# Patient Record
Sex: Female | Born: 1990 | Race: Black or African American | Hispanic: No | Marital: Single | Smoking: Never smoker
Health system: Southern US, Community
[De-identification: ages and names within clinical notes are randomized; demographics above are authoritative.]

## PROBLEM LIST (undated history)

## (undated) DIAGNOSIS — E079 Disorder of thyroid, unspecified: Secondary | ICD-10-CM

---

## 2015-04-09 DIAGNOSIS — F3164 Bipolar disorder, current episode mixed, severe, with psychotic features: Secondary | ICD-10-CM | POA: Diagnosis not present

## 2015-04-16 DIAGNOSIS — F3164 Bipolar disorder, current episode mixed, severe, with psychotic features: Secondary | ICD-10-CM | POA: Diagnosis not present

## 2015-04-21 DIAGNOSIS — F312 Bipolar disorder, current episode manic severe with psychotic features: Secondary | ICD-10-CM | POA: Diagnosis not present

## 2015-04-23 DIAGNOSIS — F3164 Bipolar disorder, current episode mixed, severe, with psychotic features: Secondary | ICD-10-CM | POA: Diagnosis not present

## 2015-04-27 DIAGNOSIS — F3164 Bipolar disorder, current episode mixed, severe, with psychotic features: Secondary | ICD-10-CM | POA: Diagnosis not present

## 2015-04-29 DIAGNOSIS — F312 Bipolar disorder, current episode manic severe with psychotic features: Secondary | ICD-10-CM | POA: Diagnosis not present

## 2015-05-07 DIAGNOSIS — F3164 Bipolar disorder, current episode mixed, severe, with psychotic features: Secondary | ICD-10-CM | POA: Diagnosis not present

## 2015-05-17 DIAGNOSIS — F3164 Bipolar disorder, current episode mixed, severe, with psychotic features: Secondary | ICD-10-CM | POA: Diagnosis not present

## 2015-05-27 DIAGNOSIS — F3164 Bipolar disorder, current episode mixed, severe, with psychotic features: Secondary | ICD-10-CM | POA: Diagnosis not present

## 2015-06-02 DIAGNOSIS — F312 Bipolar disorder, current episode manic severe with psychotic features: Secondary | ICD-10-CM | POA: Diagnosis not present

## 2015-06-04 DIAGNOSIS — F3164 Bipolar disorder, current episode mixed, severe, with psychotic features: Secondary | ICD-10-CM | POA: Diagnosis not present

## 2015-06-11 DIAGNOSIS — F3164 Bipolar disorder, current episode mixed, severe, with psychotic features: Secondary | ICD-10-CM | POA: Diagnosis not present

## 2015-06-17 DIAGNOSIS — F3164 Bipolar disorder, current episode mixed, severe, with psychotic features: Secondary | ICD-10-CM | POA: Diagnosis not present

## 2015-06-25 DIAGNOSIS — F3164 Bipolar disorder, current episode mixed, severe, with psychotic features: Secondary | ICD-10-CM | POA: Diagnosis not present

## 2015-06-30 DIAGNOSIS — F312 Bipolar disorder, current episode manic severe with psychotic features: Secondary | ICD-10-CM | POA: Diagnosis not present

## 2015-07-02 DIAGNOSIS — F3164 Bipolar disorder, current episode mixed, severe, with psychotic features: Secondary | ICD-10-CM | POA: Diagnosis not present

## 2015-07-08 DIAGNOSIS — F3164 Bipolar disorder, current episode mixed, severe, with psychotic features: Secondary | ICD-10-CM | POA: Diagnosis not present

## 2015-07-16 DIAGNOSIS — F3164 Bipolar disorder, current episode mixed, severe, with psychotic features: Secondary | ICD-10-CM | POA: Diagnosis not present

## 2015-07-19 DIAGNOSIS — F3164 Bipolar disorder, current episode mixed, severe, with psychotic features: Secondary | ICD-10-CM | POA: Diagnosis not present

## 2015-07-27 DIAGNOSIS — F312 Bipolar disorder, current episode manic severe with psychotic features: Secondary | ICD-10-CM | POA: Diagnosis not present

## 2015-07-28 DIAGNOSIS — F3164 Bipolar disorder, current episode mixed, severe, with psychotic features: Secondary | ICD-10-CM | POA: Diagnosis not present

## 2015-08-04 DIAGNOSIS — F3164 Bipolar disorder, current episode mixed, severe, with psychotic features: Secondary | ICD-10-CM | POA: Diagnosis not present

## 2015-08-10 DIAGNOSIS — F3164 Bipolar disorder, current episode mixed, severe, with psychotic features: Secondary | ICD-10-CM | POA: Diagnosis not present

## 2015-08-11 DIAGNOSIS — Z5181 Encounter for therapeutic drug level monitoring: Secondary | ICD-10-CM | POA: Diagnosis not present

## 2015-08-18 DIAGNOSIS — F3164 Bipolar disorder, current episode mixed, severe, with psychotic features: Secondary | ICD-10-CM | POA: Diagnosis not present

## 2015-08-25 DIAGNOSIS — F312 Bipolar disorder, current episode manic severe with psychotic features: Secondary | ICD-10-CM | POA: Diagnosis not present

## 2015-08-25 DIAGNOSIS — F3164 Bipolar disorder, current episode mixed, severe, with psychotic features: Secondary | ICD-10-CM | POA: Diagnosis not present

## 2015-08-31 DIAGNOSIS — F3164 Bipolar disorder, current episode mixed, severe, with psychotic features: Secondary | ICD-10-CM | POA: Diagnosis not present

## 2015-09-08 DIAGNOSIS — F3164 Bipolar disorder, current episode mixed, severe, with psychotic features: Secondary | ICD-10-CM | POA: Diagnosis not present

## 2015-09-11 DIAGNOSIS — Z23 Encounter for immunization: Secondary | ICD-10-CM | POA: Diagnosis not present

## 2015-09-15 DIAGNOSIS — F3164 Bipolar disorder, current episode mixed, severe, with psychotic features: Secondary | ICD-10-CM | POA: Diagnosis not present

## 2015-09-21 DIAGNOSIS — F3164 Bipolar disorder, current episode mixed, severe, with psychotic features: Secondary | ICD-10-CM | POA: Diagnosis not present

## 2015-09-29 DIAGNOSIS — F312 Bipolar disorder, current episode manic severe with psychotic features: Secondary | ICD-10-CM | POA: Diagnosis not present

## 2015-09-30 DIAGNOSIS — F3164 Bipolar disorder, current episode mixed, severe, with psychotic features: Secondary | ICD-10-CM | POA: Diagnosis not present

## 2015-10-06 DIAGNOSIS — F3164 Bipolar disorder, current episode mixed, severe, with psychotic features: Secondary | ICD-10-CM | POA: Diagnosis not present

## 2015-10-21 DIAGNOSIS — F3164 Bipolar disorder, current episode mixed, severe, with psychotic features: Secondary | ICD-10-CM | POA: Diagnosis not present

## 2015-10-27 DIAGNOSIS — F312 Bipolar disorder, current episode manic severe with psychotic features: Secondary | ICD-10-CM | POA: Diagnosis not present

## 2015-11-16 DIAGNOSIS — F3164 Bipolar disorder, current episode mixed, severe, with psychotic features: Secondary | ICD-10-CM | POA: Diagnosis not present

## 2015-11-23 DIAGNOSIS — F3164 Bipolar disorder, current episode mixed, severe, with psychotic features: Secondary | ICD-10-CM | POA: Diagnosis not present

## 2015-12-01 DIAGNOSIS — F312 Bipolar disorder, current episode manic severe with psychotic features: Secondary | ICD-10-CM | POA: Diagnosis not present

## 2015-12-02 DIAGNOSIS — F3164 Bipolar disorder, current episode mixed, severe, with psychotic features: Secondary | ICD-10-CM | POA: Diagnosis not present

## 2015-12-17 DIAGNOSIS — F3164 Bipolar disorder, current episode mixed, severe, with psychotic features: Secondary | ICD-10-CM | POA: Diagnosis not present

## 2015-12-22 DIAGNOSIS — F312 Bipolar disorder, current episode manic severe with psychotic features: Secondary | ICD-10-CM | POA: Diagnosis not present

## 2015-12-23 DIAGNOSIS — F3164 Bipolar disorder, current episode mixed, severe, with psychotic features: Secondary | ICD-10-CM | POA: Diagnosis not present

## 2015-12-29 DIAGNOSIS — F3164 Bipolar disorder, current episode mixed, severe, with psychotic features: Secondary | ICD-10-CM | POA: Diagnosis not present

## 2016-01-07 DIAGNOSIS — F3164 Bipolar disorder, current episode mixed, severe, with psychotic features: Secondary | ICD-10-CM | POA: Diagnosis not present

## 2016-01-12 DIAGNOSIS — F312 Bipolar disorder, current episode manic severe with psychotic features: Secondary | ICD-10-CM | POA: Diagnosis not present

## 2016-01-21 DIAGNOSIS — F3164 Bipolar disorder, current episode mixed, severe, with psychotic features: Secondary | ICD-10-CM | POA: Diagnosis not present

## 2016-01-27 DIAGNOSIS — F3164 Bipolar disorder, current episode mixed, severe, with psychotic features: Secondary | ICD-10-CM | POA: Diagnosis not present

## 2016-02-03 DIAGNOSIS — F3164 Bipolar disorder, current episode mixed, severe, with psychotic features: Secondary | ICD-10-CM | POA: Diagnosis not present

## 2016-02-09 DIAGNOSIS — F312 Bipolar disorder, current episode manic severe with psychotic features: Secondary | ICD-10-CM | POA: Diagnosis not present

## 2016-02-16 DIAGNOSIS — E282 Polycystic ovarian syndrome: Secondary | ICD-10-CM | POA: Diagnosis not present

## 2016-02-17 DIAGNOSIS — F3164 Bipolar disorder, current episode mixed, severe, with psychotic features: Secondary | ICD-10-CM | POA: Diagnosis not present

## 2016-02-24 DIAGNOSIS — E282 Polycystic ovarian syndrome: Secondary | ICD-10-CM | POA: Diagnosis not present

## 2016-02-24 DIAGNOSIS — E039 Hypothyroidism, unspecified: Secondary | ICD-10-CM | POA: Diagnosis not present

## 2016-02-27 DIAGNOSIS — J018 Other acute sinusitis: Secondary | ICD-10-CM | POA: Diagnosis not present

## 2016-02-27 DIAGNOSIS — H10021 Other mucopurulent conjunctivitis, right eye: Secondary | ICD-10-CM | POA: Diagnosis not present

## 2016-03-03 DIAGNOSIS — Z124 Encounter for screening for malignant neoplasm of cervix: Secondary | ICD-10-CM | POA: Diagnosis not present

## 2016-03-03 DIAGNOSIS — Z01419 Encounter for gynecological examination (general) (routine) without abnormal findings: Secondary | ICD-10-CM | POA: Diagnosis not present

## 2016-03-03 DIAGNOSIS — Z6841 Body Mass Index (BMI) 40.0 and over, adult: Secondary | ICD-10-CM | POA: Diagnosis not present

## 2016-03-06 DIAGNOSIS — F312 Bipolar disorder, current episode manic severe with psychotic features: Secondary | ICD-10-CM | POA: Diagnosis not present

## 2016-03-10 DIAGNOSIS — F3164 Bipolar disorder, current episode mixed, severe, with psychotic features: Secondary | ICD-10-CM | POA: Diagnosis not present

## 2016-03-15 DIAGNOSIS — H10021 Other mucopurulent conjunctivitis, right eye: Secondary | ICD-10-CM | POA: Diagnosis not present

## 2016-03-15 DIAGNOSIS — J018 Other acute sinusitis: Secondary | ICD-10-CM | POA: Diagnosis not present

## 2016-03-21 DIAGNOSIS — F3164 Bipolar disorder, current episode mixed, severe, with psychotic features: Secondary | ICD-10-CM | POA: Diagnosis not present

## 2016-03-28 DIAGNOSIS — F3164 Bipolar disorder, current episode mixed, severe, with psychotic features: Secondary | ICD-10-CM | POA: Diagnosis not present

## 2016-04-03 DIAGNOSIS — K5900 Constipation, unspecified: Secondary | ICD-10-CM | POA: Diagnosis not present

## 2016-04-03 DIAGNOSIS — R109 Unspecified abdominal pain: Secondary | ICD-10-CM | POA: Diagnosis not present

## 2016-04-05 DIAGNOSIS — F3164 Bipolar disorder, current episode mixed, severe, with psychotic features: Secondary | ICD-10-CM | POA: Diagnosis not present

## 2016-04-14 DIAGNOSIS — F3164 Bipolar disorder, current episode mixed, severe, with psychotic features: Secondary | ICD-10-CM | POA: Diagnosis not present

## 2016-04-19 DIAGNOSIS — F312 Bipolar disorder, current episode manic severe with psychotic features: Secondary | ICD-10-CM | POA: Diagnosis not present

## 2016-04-28 DIAGNOSIS — F3164 Bipolar disorder, current episode mixed, severe, with psychotic features: Secondary | ICD-10-CM | POA: Diagnosis not present

## 2016-04-28 DIAGNOSIS — E282 Polycystic ovarian syndrome: Secondary | ICD-10-CM | POA: Diagnosis not present

## 2016-05-04 DIAGNOSIS — F3164 Bipolar disorder, current episode mixed, severe, with psychotic features: Secondary | ICD-10-CM | POA: Diagnosis not present

## 2016-05-10 DIAGNOSIS — F3164 Bipolar disorder, current episode mixed, severe, with psychotic features: Secondary | ICD-10-CM | POA: Diagnosis not present

## 2016-05-18 DIAGNOSIS — F3164 Bipolar disorder, current episode mixed, severe, with psychotic features: Secondary | ICD-10-CM | POA: Diagnosis not present

## 2016-05-22 DIAGNOSIS — R197 Diarrhea, unspecified: Secondary | ICD-10-CM | POA: Diagnosis not present

## 2016-05-27 ENCOUNTER — Encounter (HOSPITAL_COMMUNITY): Payer: Self-pay

## 2016-05-27 ENCOUNTER — Emergency Department (HOSPITAL_COMMUNITY)
Admission: EM | Admit: 2016-05-27 | Discharge: 2016-05-27 | Disposition: A | Discharge status: Home / Self Care | Payer: BLUE CROSS/BLUE SHIELD | Attending: Emergency Medicine | Admitting: Emergency Medicine

## 2016-05-27 DIAGNOSIS — R1030 Lower abdominal pain, unspecified: Secondary | ICD-10-CM | POA: Diagnosis not present

## 2016-05-27 DIAGNOSIS — R197 Diarrhea, unspecified: Secondary | ICD-10-CM | POA: Insufficient documentation

## 2016-05-27 HISTORY — DX: Disorder of thyroid, unspecified: E07.9

## 2016-05-27 LAB — URINALYSIS, ROUTINE W REFLEX MICROSCOPIC
BILIRUBIN URINE: NEGATIVE
GLUCOSE, UA: NEGATIVE mg/dL
HGB URINE DIPSTICK: NEGATIVE
Ketones, ur: NEGATIVE mg/dL
NITRITE: NEGATIVE
Protein, ur: NEGATIVE mg/dL
SPECIFIC GRAVITY, URINE: 1.01 (ref 1.005–1.030)
pH: 5 (ref 5.0–8.0)

## 2016-05-27 LAB — COMPREHENSIVE METABOLIC PANEL
ALK PHOS: 53 U/L (ref 38–126)
ALT: 10 U/L — ABNORMAL LOW (ref 14–54)
ANION GAP: 9 (ref 5–15)
AST: 16 U/L (ref 15–41)
Albumin: 4.1 g/dL (ref 3.5–5.0)
BUN: 6 mg/dL (ref 6–20)
CALCIUM: 9.6 mg/dL (ref 8.9–10.3)
CO2: 21 mmol/L — ABNORMAL LOW (ref 22–32)
Chloride: 105 mmol/L (ref 101–111)
Creatinine, Ser: 1.05 mg/dL — ABNORMAL HIGH (ref 0.44–1.00)
Glucose, Bld: 100 mg/dL — ABNORMAL HIGH (ref 65–99)
POTASSIUM: 4 mmol/L (ref 3.5–5.1)
Sodium: 135 mmol/L (ref 135–145)
TOTAL PROTEIN: 7.2 g/dL (ref 6.5–8.1)
Total Bilirubin: 0.6 mg/dL (ref 0.3–1.2)

## 2016-05-27 LAB — CBC
HEMATOCRIT: 39.3 % (ref 36.0–46.0)
HEMOGLOBIN: 12.7 g/dL (ref 12.0–15.0)
MCH: 28.5 pg (ref 26.0–34.0)
MCHC: 32.3 g/dL (ref 30.0–36.0)
MCV: 88.3 fL (ref 78.0–100.0)
Platelets: 188 10*3/uL (ref 150–400)
RBC: 4.45 MIL/uL (ref 3.87–5.11)
RDW: 13.3 % (ref 11.5–15.5)
WBC: 10.3 10*3/uL (ref 4.0–10.5)

## 2016-05-27 LAB — GASTROINTESTINAL PANEL BY PCR, STOOL (REPLACES STOOL CULTURE)
Adenovirus F40/41: NOT DETECTED
Astrovirus: NOT DETECTED
CAMPYLOBACTER SPECIES: NOT DETECTED
CRYPTOSPORIDIUM: NOT DETECTED
CYCLOSPORA CAYETANENSIS: NOT DETECTED
Entamoeba histolytica: NOT DETECTED
Enteroaggregative E coli (EAEC): NOT DETECTED
Enteropathogenic E coli (EPEC): NOT DETECTED
Enterotoxigenic E coli (ETEC): NOT DETECTED
Giardia lamblia: NOT DETECTED
Norovirus GI/GII: NOT DETECTED
PLESIMONAS SHIGELLOIDES: NOT DETECTED
ROTAVIRUS A: NOT DETECTED
SALMONELLA SPECIES: NOT DETECTED
SAPOVIRUS (I, II, IV, AND V): NOT DETECTED
SHIGELLA/ENTEROINVASIVE E COLI (EIEC): NOT DETECTED
Shiga like toxin producing E coli (STEC): NOT DETECTED
VIBRIO SPECIES: NOT DETECTED
Vibrio cholerae: NOT DETECTED
YERSINIA ENTEROCOLITICA: NOT DETECTED

## 2016-05-27 LAB — C DIFFICILE QUICK SCREEN W PCR REFLEX
C DIFFICILE (CDIFF) INTERP: NOT DETECTED
C Diff antigen: NEGATIVE
C Diff toxin: NEGATIVE

## 2016-05-27 LAB — LIPASE, BLOOD: Lipase: 20 U/L (ref 11–51)

## 2016-05-27 LAB — POC URINE PREG, ED: PREG TEST UR: NEGATIVE

## 2016-05-27 NOTE — ED Provider Notes (Signed)
[facility]-EMERGENCY DEPT Provider Note   CSN: [id number] Arrival date & time: 05/27/16  0006   By signing my name below, I, [name], attest that this documentation has been prepared under the direction and in the presence of Derwood KaplanNanavati, Rook Maue, MD. Electronically Signed: [name], ED Scribe. 05/27/16. 10:06 PM.  History   Chief Complaint Chief Complaint  Patient presents with  . Abdominal Cramping  . Diarrhea    HPI Sydney [name] is a 26 y.o. female who presents to the Emergency Department complaining of lower abdominal pain onset 1 month ago. Pt reports associated intermittent diarrhea x 1.5 months. She notes that she has 3 watery, loose yellow, non-bloody, bowel movements daily. Pt reports that her lower abdominal pain is present prior to diarrhea episodes and resolved immediately following emptying her bowels. She notes that she increased the dose of her metformin to 500 mg BID approximately 2 months ago and she takes it for her hx of PCOS. She notes that she recently completed taking doxycyline, tobramycin, and bactrim. Pt has not tried any medications for the relief of her symptoms. She denies blood in stool or mucus in the stool. Denies PMHx of C. diff. Denies family hx of bowel issues, but notes that she has had a gastric ulcer and gastritis. Denies recent travel to the tropics, camping, or sick contacts with similar symptoms.    The history is provided by the patient and a friend. No language interpreter was used.    Past Medical History:  Diagnosis Date  . Thyroid disease     There are no active problems to display for this patient.   No past surgical history on file.  OB History    No data available       Home Medications    Prior to Admission medications   Not on File    Family History No family history on file.  Social History Social History  Substance Use Topics  . Smoking status: Never Smoker  . Smokeless tobacco: Never Used  . Alcohol use No      Allergies   Penicillins   Review of Systems Review of Systems  Gastrointestinal: Positive for abdominal pain and diarrhea. Negative for blood in stool.  All other systems reviewed and are negative.    Physical Exam Updated Vital Signs BP 126/77 (BP Location: Left Arm)   Pulse 76   Temp 98.4 F (36.9 C) (Oral)   Resp 18   LMP 05/16/2016 (Approximate)   SpO2 100%   Physical Exam  Constitutional: She is oriented to person, place, and time. She appears well-developed and well-nourished. No distress.  HENT:  Head: Normocephalic and atraumatic.  Eyes: EOM are normal.  Neck: Neck supple.  Cardiovascular: Normal rate, regular rhythm and normal heart sounds.  Exam reveals no gallop and no friction rub.   No murmur heard. Pulmonary/Chest: Effort normal and breath sounds normal. No respiratory distress. She has no wheezes. She has no rales.  Abdominal: Soft. Bowel sounds are normal. She exhibits no distension. There is no tenderness.  Abdomen soft and non-TTP.   Musculoskeletal: Normal range of motion.  Neurological: She is alert and oriented to person, place, and time.  Skin: Skin is warm and dry.  Psychiatric: She has a normal mood and affect. Her behavior is normal.  Nursing note and vitals reviewed.    ED Treatments / Results  DIAGNOSTIC STUDIES: Oxygen Saturation is 100% on RA, nl by my interpretation.    COORDINATION OF CARE: 3:54 AM  Discussed treatment plan with pt at bedside which includes labs, UA, and pt agreed to plan.   Labs (all labs ordered are listed, but only abnormal results are displayed) Labs Reviewed  COMPREHENSIVE METABOLIC PANEL - Abnormal; Notable for the following:       Result Value   CO2 21 (*)    Glucose, Bld 100 (*)    Creatinine, Ser 1.05 (*)    ALT 10 (*)    All other components within normal limits  URINALYSIS, ROUTINE W REFLEX MICROSCOPIC - Abnormal; Notable for the following:    APPearance HAZY (*)    Leukocytes, UA TRACE (*)     Bacteria, UA FEW (*)    Squamous Epithelial / LPF 0-5 (*)    All other components within normal limits  C DIFFICILE QUICK SCREEN W PCR REFLEX  GASTROINTESTINAL PANEL BY PCR, STOOL (REPLACES STOOL CULTURE)  LIPASE, BLOOD  CBC  POC URINE PREG, ED    EKG  EKG Interpretation None       Radiology No results found.  Procedures Procedures (including critical care time)  Medications Ordered in ED Medications - No data to display   Initial Impression / Assessment and Plan / ED Course  I have reviewed the triage vital signs and the nursing notes.  Pertinent labs & imaging results that were available during my care of the patient were reviewed by me and considered in my medical decision making (see chart for details).     Pt comes in with cc of diarrhea. 3-5 loose BM, different colored for the past several days. Pt has had increased metformin for same duration. Pt has had doxycycline and some UTI antibiotics in the last 1 month. Pt has PCOS hx.  Cdiff is possible, due to antibiotics mainly. Doesn't appear to be a viral infection, mainly due to the duration. Metformin can cause diarrhea, but she has some pain associated with her diarrhea, which is not typical of metformin.  Pt is to see GI doctors in 10 days. Cdiff sent. Pt would prefer going home now rather than wait for results as she has been in the ER for a long time already - I think that's reasonable. Stable for d/c.  LATE ENTRY: CDIFF STUDY WAS NEGATIVE.  Final Clinical Impressions(s) / ED Diagnoses   Final diagnoses:  Diarrhea, unspecified type    New Prescriptions There are no discharge medications for this patient.  I personally performed the services described in this documentation, which was scribed in my presence. The recorded information has been reviewed and is accurate.     Derwood Kaplan, MD 05/30/16 2207

## 2016-05-27 NOTE — ED Notes (Signed)
Pt requesting to see MD per secretary, Dr. Rhunette CroftNanavati notified

## 2016-05-27 NOTE — ED Notes (Signed)
Pt stable, ambulatory, states understanding of discharge instructions 

## 2016-05-27 NOTE — Discharge Instructions (Signed)
Half the metformin dose for now. See the GI doctor as planned. We will call you back if the stool studies come back positive.  Call your insurance company and get a primary doctor that is within your network.

## 2016-05-27 NOTE — ED Triage Notes (Signed)
Pt states recently finished abx for UTI. Pt complaining of intermittent diarrhea x 1 month. Pt states 3 BM's today. Pt states some abdominal cramping. Pt states bland diet today.

## 2016-05-30 NOTE — ED Notes (Signed)
Pt. Called this RN and left a message concerning her C-Diff culture results.  Called provided phone number, no answer, unable to leave a voice mail.

## 2016-06-01 DIAGNOSIS — F3164 Bipolar disorder, current episode mixed, severe, with psychotic features: Secondary | ICD-10-CM | POA: Diagnosis not present

## 2016-06-06 DIAGNOSIS — F312 Bipolar disorder, current episode manic severe with psychotic features: Secondary | ICD-10-CM | POA: Diagnosis not present

## 2016-06-06 DIAGNOSIS — K580 Irritable bowel syndrome with diarrhea: Secondary | ICD-10-CM | POA: Diagnosis not present

## 2016-06-06 DIAGNOSIS — R197 Diarrhea, unspecified: Secondary | ICD-10-CM | POA: Diagnosis not present

## 2016-06-08 DIAGNOSIS — F3164 Bipolar disorder, current episode mixed, severe, with psychotic features: Secondary | ICD-10-CM | POA: Diagnosis not present

## 2016-06-22 DIAGNOSIS — F3164 Bipolar disorder, current episode mixed, severe, with psychotic features: Secondary | ICD-10-CM | POA: Diagnosis not present

## 2016-07-06 DIAGNOSIS — F3164 Bipolar disorder, current episode mixed, severe, with psychotic features: Secondary | ICD-10-CM | POA: Diagnosis not present

## 2016-07-21 DIAGNOSIS — R197 Diarrhea, unspecified: Secondary | ICD-10-CM | POA: Diagnosis not present

## 2016-07-27 DIAGNOSIS — F3164 Bipolar disorder, current episode mixed, severe, with psychotic features: Secondary | ICD-10-CM | POA: Diagnosis not present

## 2016-07-31 DIAGNOSIS — F3164 Bipolar disorder, current episode mixed, severe, with psychotic features: Secondary | ICD-10-CM | POA: Diagnosis not present

## 2016-08-10 DIAGNOSIS — F3164 Bipolar disorder, current episode mixed, severe, with psychotic features: Secondary | ICD-10-CM | POA: Diagnosis not present

## 2016-08-17 DIAGNOSIS — F3164 Bipolar disorder, current episode mixed, severe, with psychotic features: Secondary | ICD-10-CM | POA: Diagnosis not present

## 2016-08-24 DIAGNOSIS — F3164 Bipolar disorder, current episode mixed, severe, with psychotic features: Secondary | ICD-10-CM | POA: Diagnosis not present

## 2016-08-25 DIAGNOSIS — M79672 Pain in left foot: Secondary | ICD-10-CM | POA: Diagnosis not present

## 2016-08-25 DIAGNOSIS — M722 Plantar fascial fibromatosis: Secondary | ICD-10-CM | POA: Diagnosis not present

## 2016-08-31 DIAGNOSIS — F3164 Bipolar disorder, current episode mixed, severe, with psychotic features: Secondary | ICD-10-CM | POA: Diagnosis not present

## 2016-09-07 DIAGNOSIS — F3164 Bipolar disorder, current episode mixed, severe, with psychotic features: Secondary | ICD-10-CM | POA: Diagnosis not present

## 2016-09-07 DIAGNOSIS — M722 Plantar fascial fibromatosis: Secondary | ICD-10-CM | POA: Diagnosis not present

## 2016-09-12 DIAGNOSIS — F3164 Bipolar disorder, current episode mixed, severe, with psychotic features: Secondary | ICD-10-CM | POA: Diagnosis not present

## 2016-09-21 DIAGNOSIS — F312 Bipolar disorder, current episode manic severe with psychotic features: Secondary | ICD-10-CM | POA: Diagnosis not present

## 2016-09-21 DIAGNOSIS — F3164 Bipolar disorder, current episode mixed, severe, with psychotic features: Secondary | ICD-10-CM | POA: Diagnosis not present

## 2016-09-28 DIAGNOSIS — F3164 Bipolar disorder, current episode mixed, severe, with psychotic features: Secondary | ICD-10-CM | POA: Diagnosis not present

## 2016-10-05 DIAGNOSIS — F3164 Bipolar disorder, current episode mixed, severe, with psychotic features: Secondary | ICD-10-CM | POA: Diagnosis not present

## 2016-10-18 DIAGNOSIS — Z23 Encounter for immunization: Secondary | ICD-10-CM | POA: Diagnosis not present

## 2016-10-19 DIAGNOSIS — F3164 Bipolar disorder, current episode mixed, severe, with psychotic features: Secondary | ICD-10-CM | POA: Diagnosis not present

## 2016-10-26 DIAGNOSIS — F3164 Bipolar disorder, current episode mixed, severe, with psychotic features: Secondary | ICD-10-CM | POA: Diagnosis not present

## 2016-10-29 ENCOUNTER — Encounter (HOSPITAL_COMMUNITY): Payer: Self-pay | Admitting: Emergency Medicine

## 2016-10-29 ENCOUNTER — Observation Stay (HOSPITAL_COMMUNITY)
Admission: EM | Admit: 2016-10-29 | Discharge: 2016-10-31 | Disposition: A | Discharge status: Home / Self Care | Payer: BLUE CROSS/BLUE SHIELD | Attending: Surgery | Admitting: Surgery

## 2016-10-29 DIAGNOSIS — E282 Polycystic ovarian syndrome: Secondary | ICD-10-CM | POA: Insufficient documentation

## 2016-10-29 DIAGNOSIS — Z79899 Other long term (current) drug therapy: Secondary | ICD-10-CM | POA: Insufficient documentation

## 2016-10-29 DIAGNOSIS — Z3202 Encounter for pregnancy test, result negative: Secondary | ICD-10-CM | POA: Diagnosis not present

## 2016-10-29 DIAGNOSIS — K8050 Calculus of bile duct without cholangitis or cholecystitis without obstruction: Secondary | ICD-10-CM | POA: Diagnosis not present

## 2016-10-29 DIAGNOSIS — R1013 Epigastric pain: Secondary | ICD-10-CM | POA: Diagnosis not present

## 2016-10-29 DIAGNOSIS — K219 Gastro-esophageal reflux disease without esophagitis: Secondary | ICD-10-CM | POA: Diagnosis not present

## 2016-10-29 DIAGNOSIS — E039 Hypothyroidism, unspecified: Secondary | ICD-10-CM | POA: Insufficient documentation

## 2016-10-29 DIAGNOSIS — K810 Acute cholecystitis: Principal | ICD-10-CM | POA: Diagnosis present

## 2016-10-29 DIAGNOSIS — Z6841 Body Mass Index (BMI) 40.0 and over, adult: Secondary | ICD-10-CM | POA: Diagnosis not present

## 2016-10-29 DIAGNOSIS — R110 Nausea: Secondary | ICD-10-CM | POA: Diagnosis not present

## 2016-10-29 DIAGNOSIS — F319 Bipolar disorder, unspecified: Secondary | ICD-10-CM | POA: Insufficient documentation

## 2016-10-29 LAB — COMPREHENSIVE METABOLIC PANEL
ALBUMIN: 3.6 g/dL (ref 3.5–5.0)
ALK PHOS: 53 U/L (ref 38–126)
ALT: 9 U/L — AB (ref 14–54)
ANION GAP: 9 (ref 5–15)
AST: 13 U/L — ABNORMAL LOW (ref 15–41)
BUN: <5 mg/dL — ABNORMAL LOW (ref 6–20)
CHLORIDE: 108 mmol/L (ref 101–111)
CO2: 17 mmol/L — AB (ref 22–32)
Calcium: 9.3 mg/dL (ref 8.9–10.3)
Creatinine, Ser: 0.73 mg/dL (ref 0.44–1.00)
GFR calc Af Amer: >60 mL/min (ref >60)
GFR calc non Af Amer: >60 mL/min (ref >60)
GLUCOSE: 112 mg/dL — AB (ref 65–99)
Potassium: 4 mmol/L (ref 3.5–5.1)
SODIUM: 134 mmol/L — AB (ref 135–145)
Total Bilirubin: 1 mg/dL (ref 0.3–1.2)
Total Protein: 7.2 g/dL (ref 6.5–8.1)

## 2016-10-29 LAB — URINALYSIS, ROUTINE W REFLEX MICROSCOPIC
Bilirubin Urine: NEGATIVE
Glucose, UA: NEGATIVE mg/dL
Hgb urine dipstick: NEGATIVE
KETONES UR: 20 mg/dL — AB
Nitrite: NEGATIVE
Protein, ur: NEGATIVE mg/dL
Specific Gravity, Urine: 1.014 (ref 1.005–1.030)
pH: 7 (ref 5.0–8.0)

## 2016-10-29 LAB — CBC
HCT: 38.4 % (ref 36.0–46.0)
HEMOGLOBIN: 12.5 g/dL (ref 12.0–15.0)
MCH: 28.7 pg (ref 26.0–34.0)
MCHC: 32.6 g/dL (ref 30.0–36.0)
MCV: 88.3 fL (ref 78.0–100.0)
Platelets: 169 10*3/uL (ref 150–400)
RBC: 4.35 MIL/uL (ref 3.87–5.11)
RDW: 13.1 % (ref 11.5–15.5)
WBC: 14.6 10*3/uL — ABNORMAL HIGH (ref 4.0–10.5)

## 2016-10-29 LAB — I-STAT BETA HCG BLOOD, ED (MC, WL, AP ONLY)

## 2016-10-29 LAB — LIPASE, BLOOD: LIPASE: 16 U/L (ref 11–51)

## 2016-10-29 NOTE — ED Triage Notes (Signed)
Pt states that she is been having abd pain since this morning was seen on a UC and sent home with a diagnose of GERD when pt got to her house she started vomiting green bile secretion and call hotline RN and they suggested for pt to come to the ED. No fever or chills.

## 2016-10-30 ENCOUNTER — Emergency Department (HOSPITAL_COMMUNITY): Payer: BLUE CROSS/BLUE SHIELD

## 2016-10-30 ENCOUNTER — Encounter (HOSPITAL_COMMUNITY)
Admission: EM | Disposition: A | Discharge status: Home / Self Care | Payer: Self-pay | Source: Home / Self Care | Attending: Emergency Medicine

## 2016-10-30 ENCOUNTER — Encounter (HOSPITAL_COMMUNITY): Payer: Self-pay | Admitting: Anesthesiology

## 2016-10-30 ENCOUNTER — Observation Stay (HOSPITAL_COMMUNITY): Payer: BLUE CROSS/BLUE SHIELD | Admitting: Anesthesiology

## 2016-10-30 DIAGNOSIS — K219 Gastro-esophageal reflux disease without esophagitis: Secondary | ICD-10-CM | POA: Diagnosis not present

## 2016-10-30 DIAGNOSIS — K810 Acute cholecystitis: Secondary | ICD-10-CM | POA: Diagnosis present

## 2016-10-30 DIAGNOSIS — R1013 Epigastric pain: Secondary | ICD-10-CM | POA: Diagnosis not present

## 2016-10-30 DIAGNOSIS — F319 Bipolar disorder, unspecified: Secondary | ICD-10-CM | POA: Diagnosis not present

## 2016-10-30 DIAGNOSIS — K8020 Calculus of gallbladder without cholecystitis without obstruction: Secondary | ICD-10-CM | POA: Diagnosis not present

## 2016-10-30 DIAGNOSIS — K8000 Calculus of gallbladder with acute cholecystitis without obstruction: Secondary | ICD-10-CM | POA: Diagnosis not present

## 2016-10-30 DIAGNOSIS — R1011 Right upper quadrant pain: Secondary | ICD-10-CM | POA: Diagnosis not present

## 2016-10-30 DIAGNOSIS — E039 Hypothyroidism, unspecified: Secondary | ICD-10-CM | POA: Diagnosis not present

## 2016-10-30 HISTORY — PX: CHOLECYSTECTOMY: SHX55

## 2016-10-30 SURGERY — LAPAROSCOPIC CHOLECYSTECTOMY WITH INTRAOPERATIVE CHOLANGIOGRAM
Anesthesia: General | Site: Abdomen

## 2016-10-30 MED ORDER — MORPHINE SULFATE (PF) 4 MG/ML IV SOLN
4.0000 mg | Freq: Once | INTRAVENOUS | Status: AC
  Administered 2016-10-30: 4 mg via INTRAVENOUS
  Filled 2016-10-30: qty 1

## 2016-10-30 MED ORDER — FENTANYL CITRATE (PF) 100 MCG/2ML IJ SOLN
INTRAMUSCULAR | Status: DC | PRN
  Administered 2016-10-30: 75 ug via INTRAVENOUS
  Administered 2016-10-30 (×2): 25 ug via INTRAVENOUS

## 2016-10-30 MED ORDER — GLYCOPYRROLATE 0.2 MG/ML IJ SOLN
INTRAMUSCULAR | Status: DC | PRN
  Administered 2016-10-30: 0.4 mg via INTRAVENOUS

## 2016-10-30 MED ORDER — MIDAZOLAM HCL 2 MG/2ML IJ SOLN
INTRAMUSCULAR | Status: AC
  Filled 2016-10-30: qty 2

## 2016-10-30 MED ORDER — LACTATED RINGERS IV SOLN
INTRAVENOUS | Status: DC
  Administered 2016-10-30: 11:00:00 via INTRAVENOUS

## 2016-10-30 MED ORDER — LIDOCAINE 2% (20 MG/ML) 5 ML SYRINGE
INTRAMUSCULAR | Status: AC
  Filled 2016-10-30: qty 5

## 2016-10-30 MED ORDER — MIDAZOLAM HCL 5 MG/5ML IJ SOLN
INTRAMUSCULAR | Status: DC | PRN
  Administered 2016-10-30: 2 mg via INTRAVENOUS

## 2016-10-30 MED ORDER — PROPOFOL 10 MG/ML IV BOLUS
INTRAVENOUS | Status: AC
  Filled 2016-10-30: qty 20

## 2016-10-30 MED ORDER — FENTANYL CITRATE (PF) 250 MCG/5ML IJ SOLN
INTRAMUSCULAR | Status: AC
  Filled 2016-10-30: qty 5

## 2016-10-30 MED ORDER — SODIUM CHLORIDE 0.9 % IV BOLUS (SEPSIS)
1000.0000 mL | Freq: Once | INTRAVENOUS | Status: AC
  Administered 2016-10-30: 1000 mL via INTRAVENOUS

## 2016-10-30 MED ORDER — ROCURONIUM BROMIDE 10 MG/ML (PF) SYRINGE
PREFILLED_SYRINGE | INTRAVENOUS | Status: AC
  Filled 2016-10-30: qty 5

## 2016-10-30 MED ORDER — ONDANSETRON HCL 4 MG/2ML IJ SOLN
INTRAMUSCULAR | Status: AC
  Filled 2016-10-30: qty 2

## 2016-10-30 MED ORDER — OXYCODONE HCL 5 MG PO TABS
5.0000 mg | ORAL_TABLET | Freq: Once | ORAL | Status: AC | PRN
  Administered 2016-10-30: 5 mg via ORAL

## 2016-10-30 MED ORDER — IOPAMIDOL (ISOVUE-300) INJECTION 61%
INTRAVENOUS | Status: AC
  Filled 2016-10-30: qty 50

## 2016-10-30 MED ORDER — ROCURONIUM BROMIDE 100 MG/10ML IV SOLN
INTRAVENOUS | Status: DC | PRN
  Administered 2016-10-30: 30 mg via INTRAVENOUS
  Administered 2016-10-30: 10 mg via INTRAVENOUS

## 2016-10-30 MED ORDER — BUPIVACAINE-EPINEPHRINE 0.25% -1:200000 IJ SOLN: INTRAMUSCULAR | Status: DC | PRN

## 2016-10-30 MED ORDER — 0.9 % SODIUM CHLORIDE (POUR BTL) OPTIME
TOPICAL | Status: DC | PRN
  Administered 2016-10-30: 1000 mL

## 2016-10-30 MED ORDER — DEXTROSE-NACL 5-0.9 % IV SOLN
INTRAVENOUS | Status: DC
  Administered 2016-10-30 (×2): via INTRAVENOUS

## 2016-10-30 MED ORDER — CIPROFLOXACIN IN D5W 400 MG/200ML IV SOLN
400.0000 mg | Freq: Two times a day (BID) | INTRAVENOUS | Status: DC
  Administered 2016-10-30 – 2016-10-31 (×3): 400 mg via INTRAVENOUS
  Filled 2016-10-30 (×3): qty 200

## 2016-10-30 MED ORDER — ONDANSETRON HCL 4 MG/2ML IJ SOLN
4.0000 mg | Freq: Once | INTRAMUSCULAR | Status: AC
  Administered 2016-10-30: 4 mg via INTRAVENOUS

## 2016-10-30 MED ORDER — OXYCODONE HCL 5 MG/5ML PO SOLN: 5.0000 mg | Freq: Once | ORAL | Status: AC | PRN

## 2016-10-30 MED ORDER — LIDOCAINE HCL (CARDIAC) 20 MG/ML IV SOLN
INTRAVENOUS | Status: DC | PRN
  Administered 2016-10-30: 40 mg via INTRAVENOUS

## 2016-10-30 MED ORDER — NEOSTIGMINE METHYLSULFATE 10 MG/10ML IV SOLN
INTRAVENOUS | Status: DC | PRN
  Administered 2016-10-30: 3 mg via INTRAVENOUS

## 2016-10-30 MED ORDER — OXYCODONE HCL 5 MG PO TABS
ORAL_TABLET | ORAL | Status: AC
  Administered 2016-10-30: 14:00:00
  Filled 2016-10-30: qty 1

## 2016-10-30 MED ORDER — NEOSTIGMINE METHYLSULFATE 5 MG/5ML IV SOSY
PREFILLED_SYRINGE | INTRAVENOUS | Status: AC
  Filled 2016-10-30: qty 5

## 2016-10-30 MED ORDER — SODIUM CHLORIDE 0.9 % IR SOLN
Status: DC | PRN
  Administered 2016-10-30: 1

## 2016-10-30 MED ORDER — DEXAMETHASONE SODIUM PHOSPHATE 10 MG/ML IJ SOLN
INTRAMUSCULAR | Status: DC | PRN
  Administered 2016-10-30: 10 mg via INTRAVENOUS

## 2016-10-30 MED ORDER — ONDANSETRON 4 MG PO TBDP: 4.0000 mg | ORAL_TABLET | Freq: Four times a day (QID) | ORAL | Status: DC | PRN

## 2016-10-30 MED ORDER — BUPIVACAINE-EPINEPHRINE (PF) 0.25% -1:200000 IJ SOLN
INTRAMUSCULAR | Status: AC
  Filled 2016-10-30: qty 30

## 2016-10-30 MED ORDER — DEXAMETHASONE SODIUM PHOSPHATE 10 MG/ML IJ SOLN
INTRAMUSCULAR | Status: AC
  Filled 2016-10-30: qty 1

## 2016-10-30 MED ORDER — ONDANSETRON HCL 4 MG/2ML IJ SOLN
INTRAMUSCULAR | Status: DC | PRN
  Administered 2016-10-30: 4 mg via INTRAVENOUS

## 2016-10-30 MED ORDER — HYDROMORPHONE HCL 1 MG/ML IJ SOLN
0.2500 mg | INTRAMUSCULAR | Status: DC | PRN
  Administered 2016-10-30: 0.5 mg via INTRAVENOUS

## 2016-10-30 MED ORDER — HYDROMORPHONE HCL 1 MG/ML IJ SOLN
INTRAMUSCULAR | Status: AC
  Administered 2016-10-30: 14:00:00
  Filled 2016-10-30: qty 1

## 2016-10-30 MED ORDER — ONDANSETRON HCL 4 MG/2ML IJ SOLN
4.0000 mg | Freq: Four times a day (QID) | INTRAMUSCULAR | Status: DC | PRN
  Administered 2016-10-30: 4 mg via INTRAVENOUS
  Filled 2016-10-30: qty 2

## 2016-10-30 MED ORDER — OXYCODONE HCL 5 MG PO TABS
5.0000 mg | ORAL_TABLET | ORAL | Status: DC | PRN
  Administered 2016-10-30 – 2016-10-31 (×2): 10 mg via ORAL
  Filled 2016-10-30 (×3): qty 2

## 2016-10-30 MED ORDER — CIPROFLOXACIN IN D5W 400 MG/200ML IV SOLN
400.0000 mg | Freq: Two times a day (BID) | INTRAVENOUS | Status: DC
  Filled 2016-10-30: qty 200

## 2016-10-30 MED ORDER — PROMETHAZINE HCL 25 MG/ML IJ SOLN: 6.2500 mg | INTRAMUSCULAR | Status: DC | PRN

## 2016-10-30 MED ORDER — HYDROMORPHONE HCL 1 MG/ML IJ SOLN
1.0000 mg | INTRAMUSCULAR | Status: DC | PRN
  Administered 2016-10-30: 1 mg via INTRAVENOUS
  Filled 2016-10-30 (×2): qty 1

## 2016-10-30 MED ORDER — PROPOFOL 10 MG/ML IV BOLUS
INTRAVENOUS | Status: DC | PRN
  Administered 2016-10-30: 120 mg via INTRAVENOUS

## 2016-10-30 MED ORDER — MORPHINE SULFATE (PF) 4 MG/ML IV SOLN: 1.0000 mg | INTRAVENOUS | Status: DC | PRN

## 2016-10-30 SURGICAL SUPPLY — 34 items
APPLIER CLIP 5 13 M/L LIGAMAX5 (MISCELLANEOUS) ×2
BLADE CLIPPER SURG (BLADE) IMPLANT
CANISTER SUCT 3000ML PPV (MISCELLANEOUS) ×2 IMPLANT
CHLORAPREP W/TINT 26ML (MISCELLANEOUS) ×2 IMPLANT
CLIP APPLIE 5 13 M/L LIGAMAX5 (MISCELLANEOUS) ×1 IMPLANT
COVER MAYO STAND STRL (DRAPES) IMPLANT
COVER SURGICAL LIGHT HANDLE (MISCELLANEOUS) ×2 IMPLANT
DERMABOND ADVANCED (GAUZE/BANDAGES/DRESSINGS) ×1
DERMABOND ADVANCED .7 DNX12 (GAUZE/BANDAGES/DRESSINGS) ×1 IMPLANT
DRAPE C-ARM 42X72 X-RAY (DRAPES) IMPLANT
ELECT REM PT RETURN 9FT ADLT (ELECTROSURGICAL) ×2
ELECTRODE REM PT RTRN 9FT ADLT (ELECTROSURGICAL) ×1 IMPLANT
GLOVE SURG SIGNA 7.5 PF LTX (GLOVE) ×2 IMPLANT
GOWN STRL REUS W/ TWL LRG LVL3 (GOWN DISPOSABLE) ×2 IMPLANT
GOWN STRL REUS W/ TWL XL LVL3 (GOWN DISPOSABLE) ×1 IMPLANT
GOWN STRL REUS W/TWL LRG LVL3 (GOWN DISPOSABLE) ×2
GOWN STRL REUS W/TWL XL LVL3 (GOWN DISPOSABLE) ×1
KIT BASIN OR (CUSTOM PROCEDURE TRAY) ×2 IMPLANT
KIT ROOM TURNOVER OR (KITS) ×2 IMPLANT
NS IRRIG 1000ML POUR BTL (IV SOLUTION) ×2 IMPLANT
PAD ARMBOARD 7.5X6 YLW CONV (MISCELLANEOUS) ×2 IMPLANT
POUCH SPECIMEN RETRIEVAL 10MM (ENDOMECHANICALS) ×2 IMPLANT
SCISSORS LAP 5X35 DISP (ENDOMECHANICALS) ×2 IMPLANT
SET CHOLANGIOGRAPH 5 50 .035 (SET/KITS/TRAYS/PACK) IMPLANT
SET IRRIG TUBING LAPAROSCOPIC (IRRIGATION / IRRIGATOR) ×2 IMPLANT
SLEEVE ENDOPATH XCEL 5M (ENDOMECHANICALS) ×4 IMPLANT
SPECIMEN JAR SMALL (MISCELLANEOUS) ×2 IMPLANT
SUT MNCRL AB 4-0 PS2 18 (SUTURE) ×2 IMPLANT
TOWEL OR 17X24 6PK STRL BLUE (TOWEL DISPOSABLE) ×2 IMPLANT
TOWEL OR 17X26 10 PK STRL BLUE (TOWEL DISPOSABLE) ×2 IMPLANT
TRAY LAPAROSCOPIC MC (CUSTOM PROCEDURE TRAY) ×2 IMPLANT
TROCAR XCEL BLUNT TIP 100MML (ENDOMECHANICALS) ×2 IMPLANT
TROCAR XCEL NON-BLD 5MMX100MML (ENDOMECHANICALS) ×2 IMPLANT
TUBING INSUFFLATION (TUBING) ×2 IMPLANT

## 2016-10-30 NOTE — ED Notes (Signed)
[facility]

## 2016-10-30 NOTE — Progress Notes (Signed)
Patient ID: Sydney [name], female   DOB: January 12, 1990, 26 y.o.   MRN: [id number]   Patient with symptomatic gallstones, possible acute cholecystitis.  A laparoscopic cholecystectomy is planned for today I discussed the procedure in detail.    We discussed the risks and benefits of a laparoscopic cholecystectomy and possible cholangiogram including, but not limited to bleeding, infection, injury to surrounding structures such as the intestine or liver, bile leak, retained gallstones, need to convert to an open procedure, prolonged diarrhea, blood clots such as  DVT, common bile duct injury, anesthesia risks, and possible need for additional procedures.  The likelihood of improvement in symptoms and return to the patient's normal status is good. We discussed the typical post-operative recovery course.

## 2016-10-30 NOTE — Progress Notes (Addendum)
Called Dietary to order CLD VEGAN dinner tray.  Unable to Modify order. Dietary only has Vegetarian, notified pt and she said if she feels hungry she will have boyfriend get something.

## 2016-10-30 NOTE — Op Note (Signed)
Laparoscopic Cholecystectomy Procedure Note  Indications: This patient presents with symptomatic gallbladder disease and will undergo laparoscopic cholecystectomy.  Pre-operative Diagnosis: Calculus of gallbladder with acute cholecystitis, without mention of obstruction  Post-operative Diagnosis: Same  Surgeon: Abigail MiyamotoBLACKMAN,Sydney Sydney [name]   Assistants: 0  Anesthesia: General endotracheal anesthesia  ASA Class: 2  Procedure Details  The patient was seen again in the Holding Room. The risks, benefits, complications, treatment options, and expected outcomes were discussed with the patient. The possibilities of reaction to medication, pulmonary aspiration, perforation of viscus, bleeding, recurrent infection, finding Sydney [name] normal gallbladder, the need for additional procedures, failure to diagnose Sydney [name] condition, the possible need to convert to an open procedure, and creating Sydney [name] complication requiring transfusion or operation were discussed with the patient. The likelihood of improving the patient's symptoms with return to their baseline status is good.  The patient and/or family concurred with the proposed plan, giving informed consent. The site of surgery properly noted. The patient was taken to Operating Room, identified as Sydney Sydney [name] and the procedure verified as Laparoscopic Cholecystectomy with Intraoperative Cholangiogram. Sydney [name] Time Out was held and the above information confirmed.  Prior to the induction of general anesthesia, antibiotic prophylaxis was administered. General endotracheal anesthesia was then administered and tolerated well. After the induction, the abdomen was prepped with Chloraprep and draped in sterile fashion. The patient was positioned in the supine position.  Local anesthetic agent was injected into the skin near the umbilicus and an incision made. We dissected down to the abdominal fascia with blunt dissection.  The fascia was incised vertically and we entered the peritoneal cavity  bluntly.  Sydney [name] pursestring suture of 0-Vicryl was placed around the fascial opening.  The Hasson cannula was inserted and secured with the stay suture.  Pneumoperitoneum was then created with CO2 and tolerated well without any adverse changes in the patient's vital signs. Sydney [name] 5-mm port was placed in the subxiphoid position.  Two 5-mm ports were placed in the right upper quadrant. All skin incisions were infiltrated with Sydney [name] local anesthetic agent before making the incision and placing the trocars.   We positioned the patient in reverse Trendelenburg, tilted slightly to the patient's left.  The gallbladder was identified and found to be acutely inflamed with multiple adhesions.  I had aspirated bile from the gallbladder in order to facilitate grasping it.  Once this was done, the fundus grasped and retracted cephalad. Adhesions were lysed bluntly and with the electrocautery where indicated, taking care not to injure any adjacent organs or viscus. The infundibulum was grasped and retracted laterally, exposing the peritoneum overlying the triangle of Calot. This was then divided and exposed in Sydney [name] blunt fashion. The cystic duct was clearly identified and bluntly dissected circumferentially. Sydney [name] critical view of the cystic duct and cystic artery was obtained.  The cystic duct was then ligated with clips and divided. The cystic artery was, dissected free, ligated with clips and divided as well.   The gallbladder was dissected from the liver bed in retrograde fashion with the electrocautery. The gallbladder was removed and placed in an Endocatch sac. The liver bed was irrigated and inspected. Hemostasis was achieved with the electrocautery. Copious irrigation was utilized and was repeatedly aspirated until clear.  The gallbladder and Endocatch sac were then removed through the umbilical port site.  The pursestring suture was used to close the umbilical fascia.    We again inspected the right upper quadrant for hemostasis.   Pneumoperitoneum was released as we removed the trocars.  4-0 Monocryl was used to close the skin.   Skin glue was then applied. The patient was then extubated and brought to the recovery room in stable condition. Instrument, sponge, and needle counts were correct at closure and at the conclusion of the case.   Findings: Acute cholecystitis with Cholelithiasis  Estimated Blood Loss: Minimal         Drains: 0         Specimens: Gallbladder           Complications: None; patient tolerated the procedure well.         Disposition: PACU - hemodynamically stable.         Condition: stable

## 2016-10-30 NOTE — ED Notes (Signed)
Patient transported to X-ray and US 

## 2016-10-30 NOTE — Anesthesia Procedure Notes (Signed)
Procedure Name: Intubation Date/Time: 10/30/2016 12:12 PM Performed by: Rush Farmer E Pre-anesthesia Checklist: Patient identified, Emergency Drugs available, Suction available and Patient being monitored Patient Re-evaluated:Patient Re-evaluated prior to induction Oxygen Delivery Method: Circle system utilized Preoxygenation: Pre-oxygenation with 100% oxygen Induction Type: IV induction Ventilation: Mask ventilation without difficulty Laryngoscope Size: Mac and 3 Grade View: Grade I Tube type: Oral Tube size: 7.0 mm Number of attempts: 1 Airway Equipment and Method: Stylet Placement Confirmation: ETT inserted through vocal cords under direct vision,  positive ETCO2 and breath sounds checked- equal and bilateral Secured at: 21 cm Tube secured with: Tape Dental Injury: Teeth and Oropharynx as per pre-operative assessment

## 2016-10-30 NOTE — Anesthesia Preprocedure Evaluation (Addendum)
Anesthesia Evaluation  Patient identified by MRN, date of birth, ID band Patient awake    Reviewed: Allergy & Precautions, NPO status , Patient's Chart, lab work & pertinent test results  Airway Mallampati: II  TM Distance: >3 FB Neck ROM: Full    Dental no notable dental hx. (+) Teeth Intact   Pulmonary neg pulmonary ROS,    Pulmonary exam normal breath sounds clear to auscultation       Cardiovascular negative cardio ROS Normal cardiovascular exam Rhythm:Regular Rate:Normal  ECG: SR, rate 67   Neuro/Psych  Headaches, PSYCHIATRIC DISORDERS Bipolar Disorder    GI/Hepatic Neg liver ROS, GERD  Medicated and Controlled,  Endo/Other  Hypothyroidism Morbid obesity  Renal/GU negative Renal ROS     Musculoskeletal negative musculoskeletal ROS (+)   Abdominal (+) + obese,   Peds  Hematology negative hematology ROS (+)   Anesthesia Other Findings Symptomatic Gallstones  Reproductive/Obstetrics hcg negative                           Anesthesia Physical Anesthesia Plan  ASA: III  Anesthesia Plan: General   Post-op Pain Management:    Induction: Intravenous  PONV Risk Score and Plan: 3 and Ondansetron, Dexamethasone and Midazolam  Airway Management Planned: Oral ETT  Additional Equipment:   Intra-op Plan:   Post-operative Plan: Extubation in OR  Informed Consent: I have reviewed the patients History and Physical, chart, labs and discussed the procedure including the risks, benefits and alternatives for the proposed anesthesia with the patient or authorized representative who has indicated his/her understanding and acceptance.   Dental advisory given  Plan Discussed with: CRNA  Anesthesia Plan Comments:         Anesthesia Quick Evaluation

## 2016-10-30 NOTE — Discharge Instructions (Signed)
Acute Pain, Adult Acute pain is a type of pain that may last for just a few days or as long as six months. It is often related to an illness, injury, or medical procedure. Acute pain may be mild, moderate, or severe. It usually goes away once your injury has healed or you are no longer ill. Pain can make it hard for you to do daily activities. It can cause anxiety and lead to other problems if left untreated. Treatment depends on the cause and severity of your acute pain. Follow these instructions at home:  Check your pain level as told by your health care provider.  Take over-the-counter and prescription medicines only as told by your health care provider.  If you are taking prescription pain medicine: ? Ask your health care provider about taking a stool softener or laxative to prevent constipation. ? Do not stop taking the medicine suddenly. Talk to your health care provider about how and when to discontinue prescription pain medicine. ? If your pain is severe, do not take more pills than instructed by your health care provider. ? Do not take other over-the-counter pain medicines in addition to this medicine unless told by your health care provider. ? Do not drive or operate heavy machinery while taking prescription pain medicine.  Apply ice or heat as told by your health care provider. These may reduce swelling and pain.  Ask your health care provider if other strategies such as distraction, relaxation, or physical therapies can help your pain.  Keep all follow-up visits as told by your health care provider. This is important. Contact a health care provider if:  You have pain that is not controlled by medicine.  Your pain does not improve or gets worse.  You have side effects from pain medicines, such as vomitingor confusion. Get help right away if:  You have severe pain.  You have trouble breathing.  You lose consciousness.  You have chest pain or pressure that lasts for  more than a few minutes. Along with the chest pain you may: ? Have pain or discomfort in one or both arms, your back, neck, jaw, or stomach. ? Have shortness of breath. ? Break out in a cold sweat. ? Feel nauseous. ? Become light-headed. These symptoms may represent a serious problem that is an emergency. Do not wait to see if the symptoms will go away. Get medical help right away. Call your local emergency services (911 in the [place] Do not drive yourself to the hospital. This information is not intended to replace advice given to you by your health care provider. Make sure you discuss any questions you have with your health care provider. Document Released: 01/03/2015 Document Revised: 05/28/2015 Document Reviewed: 01/03/2015 Elsevier Interactive Patient Education  2018 [employer]. Laparoscopic Cholecystectomy, Care After This sheet gives you information about how to care for yourself after your procedure. Your health care provider may also give you more specific instructions. If you have problems or questions, contact your health care provider. What can I expect after the procedure? After the procedure, it is common to have:  Pain at your incision sites. You will be given medicines to control this pain.  Mild nausea or vomiting.  Bloating and possible shoulder pain from the air-like gas that was used during the procedure.  Follow these instructions at home: Incision care   Follow instructions from your health care provider about how to take care of your incisions. Make sure you: ? Wash your hands  with soap and water before you change your bandage (dressing). If soap and water are not available, use hand sanitizer. ? Change your dressing as told by your health care provider. ? Leave stitches (sutures), skin glue, or adhesive strips in place. These skin closures may need to be in place for 2 weeks or longer. If adhesive strip edges start to loosen and curl up, you may trim the loose  edges. Do not remove adhesive strips completely unless your health care provider tells you to do that.  Do not take baths, swim, or use a hot tub until your health care provider approves. Ask your health care provider if you can take showers. You may only be allowed to take sponge baths for bathing.  Check your incision area every day for signs of infection. Check for: ? More redness, swelling, or pain. ? More fluid or blood. ? Warmth. ? Pus or a bad smell. Activity  Do not drive or use heavy machinery while taking prescription pain medicine.  Do not lift anything that is heavier than 10 lb (4.5 kg) until your health care provider approves.  Do not play contact sports until your health care provider approves.  Do not drive for 24 hours if you were given a medicine to help you relax (sedative).  Rest as needed. Do not return to work or school until your health care provider approves. General instructions  Take over-the-counter and prescription medicines only as told by your health care provider.  To prevent or treat constipation while you are taking prescription pain medicine, your health care provider may recommend that you: ? Drink enough fluid to keep your urine clear or pale yellow. ? Take over-the-counter or prescription medicines. ? Eat foods that are high in fiber, such as fresh fruits and vegetables, whole grains, and beans. ? Limit foods that are high in fat and processed sugars, such as fried and sweet foods. Contact a health care provider if:  You develop a rash.  You have more redness, swelling, or pain around your incisions.  You have more fluid or blood coming from your incisions.  Your incisions feel warm to the touch.  You have pus or a bad smell coming from your incisions.  You have a fever.  One or more of your incisions breaks open. Get help right away if:  You have trouble breathing.  You have chest pain.  You have increasing pain in your  shoulders.  You faint or feel dizzy when you stand.  You have severe pain in your abdomen.  You have nausea or vomiting that lasts for more than one day.  You have leg pain. This information is not intended to replace advice given to you by your health care provider. Make sure you discuss any questions you have with your health care provider. Document Released: 12/19/2004 Document Revised: 07/10/2015 Document Reviewed: 06/07/2015 Elsevier Interactive Patient Education  2017 [employer]  [facility] LAPAROSCOPIC SURGERY: POST OP INSTRUCTIONS Always review your discharge instruction sheet given to you by the facility where your surgery was performed. IF YOU HAVE DISABILITY OR FAMILY LEAVE FORMS, YOU MUST BRING THEM TO THE OFFICE FOR PROCESSING.   DO NOT GIVE THEM TO YOUR DOCTOR.  1. A prescription for pain medication may be given to you upon discharge.  Take your pain medication as prescribed, if needed.  If narcotic pain medicine is not needed, then you may take acetaminophen (Tylenol) or ibuprofen (Advil) as needed. 2. Take your  usually prescribed medications unless otherwise directed. 3. If you need a refill on your pain medication, please contact your pharmacy.  They will contact our office to request authorization. Prescriptions will not be filled after 5pm or on week-ends. 4. You should follow a light diet the first few days after arrival home, such as soup and crackers, etc.  Be sure to include lots of fluids daily. 5. Most patients will experience some swelling and bruising in the area of the incisions.  Ice packs will help.  Swelling and bruising can take several days to resolve.  6. It is common to experience some constipation if taking pain medication after surgery.  Increasing fluid intake and taking a stool softener (such as Colace) will usually help or prevent this problem from occurring.  A mild laxative (Milk of Magnesia or Miralax) should be  taken according to package instructions if there are no bowel movements after 48 hours. 7. Unless discharge instructions indicate otherwise, you may remove your bandages 24-48 hours after surgery, and you may shower at that time.  You may have steri-strips (small skin tapes) in place directly over the incision.  These strips should be left on the skin for 7-10 days.  If your surgeon used skin glue on the incision, you may shower in 24 hours.  The glue will flake off over the next 2-3 weeks.  Any sutures or staples will be removed at the office during your follow-up visit. 8. ACTIVITIES:  You may resume regular (light) daily activities beginning the next day--such as daily self-care, walking, climbing stairs--gradually increasing activities as tolerated.  You may have sexual intercourse when it is comfortable.  Refrain from any heavy lifting or straining until approved by your doctor. a. You may drive when you are no longer taking prescription pain medication, you can comfortably wear a seatbelt, and you can safely maneuver your car and apply brakes. b. RETURN TO WORK:  __________________________________________________________ 9. You should see your doctor in the office for a follow-up appointment approximately 2-3 weeks after your surgery.  Make sure that you call for this appointment within a day or two after you arrive home to insure a convenient appointment time. 10. OTHER INSTRUCTIONS: __________________________________________________________________________________________________________________________ __________________________________________________________________________________________________________________________ WHEN TO CALL YOUR DOCTOR: 1. Fever over 101.0 2. Inability to urinate 3. Continued bleeding from incision. 4. Increased pain, redness, or drainage from the incision. 5. Increasing abdominal pain  The clinic staff is available to answer your questions during regular business  hours.  Please dont hesitate to call and ask to speak to one of the nurses for clinical concerns.  If you have a medical emergency, go to the nearest emergency room or call 911.  A surgeon from [facility] is always on call at the hospital. [address], [address], [place], [place]  [address] ? [address] [address], [place], [place]   [address] [phone] ? [phone] ? FAX [phone] Web site: [email or web address]

## 2016-10-30 NOTE — Transfer of Care (Signed)
Immediate Anesthesia Transfer of Care Note  Patient: Sydney [name]  Procedure(s) Performed: LAPAROSCOPIC CHOLECYSTECTOMY (N/A Abdomen)  Patient Location: PACU  Anesthesia Type:General  Level of Consciousness: awake, alert  and oriented  Airway & Oxygen Therapy: Patient Spontanous Breathing and Patient connected to nasal cannula oxygen  Post-op Assessment: Report given to RN, Post -op Vital signs reviewed and stable and Patient moving all extremities X 4  Post vital signs: Reviewed and stable  Last Vitals:  Vitals:   10/30/16 0830 10/30/16 1316  BP: (!) 116/51 94/68  Pulse: 96 77  Resp: 20 (!) 22  Temp: (!) 38.2 C (!) 36.4 C  SpO2: 100% 100%    Last Pain:  Vitals:   10/30/16 0900  TempSrc:   PainSc: 2       Patients Stated Pain Goal: 2 (10/30/16 0836)  Complications: No apparent anesthesia complications

## 2016-10-30 NOTE — H&P (Signed)
Sydney [name] is an 26 y.o. female.   Chief Complaint: Gallstones, abdominal pain HPI: Patient is a 26 year old female with approximate 24 history of epigastric abdominal pain that radiates to the right upper quadrant.  Patient states that this is the first occurrence of this particular type of pain.  States that 2 days ago she did have a high fatty meal.  Patient states that her pain last night was associated with nausea and emesis.  Patient denies any diarrhea.  Secondary to continued pain patient presented to the ER for further evaluation.  Upon evaluation in the ER she underwent ultrasound which revealed stone filled gallbladder with marginal gallbladder wall thickening.  Secondary to patient's physical exam symptoms and radiological findings general surgery was consulted for further evaluation and management.  Past Medical History:  Diagnosis Date  . Thyroid disease     History reviewed. No pertinent surgical history.  No family history on file. Social History:  reports that she has never smoked. She has never used smokeless tobacco. She reports that she does not drink alcohol or use drugs.  Allergies:  Allergies  Allergen Reactions  . Penicillins     Hives     (Not in a hospital admission)  Results for orders placed or performed during the hospital encounter of 10/29/16 (from the past 48 hour(s))  Lipase, blood     Status: None   Collection Time: 10/29/16  9:39 PM  Result Value Ref Range   Lipase 16 11 - 51 U/L  Comprehensive metabolic panel     Status: Abnormal   Collection Time: 10/29/16  9:39 PM  Result Value Ref Range   Sodium 134 (L) 135 - 145 mmol/L   Potassium 4.0 3.5 - 5.1 mmol/L   Chloride 108 101 - 111 mmol/L   CO2 17 (L) 22 - 32 mmol/L   Glucose, Bld 112 (H) 65 - 99 mg/dL   BUN <5 (L) 6 - 20 mg/dL   Creatinine, Ser 0.73 0.44 - 1.00 mg/dL   Calcium 9.3 8.9 - 10.3 mg/dL   Total Protein 7.2 6.5 - 8.1 g/dL   Albumin 3.6 3.5 - 5.0 g/dL   AST 13 (L) 15 - 41 U/L    ALT 9 (L) 14 - 54 U/L   Alkaline Phosphatase 53 38 - 126 U/L   Total Bilirubin 1.0 0.3 - 1.2 mg/dL   GFR calc non Af Amer >60 >60 mL/min   GFR calc Af Amer >60 >60 mL/min    Comment: (NOTE) The eGFR has been calculated using the CKD EPI equation. This calculation has not been validated in all clinical situations. eGFR's persistently <60 mL/min signify possible Chronic Kidney Disease.    Anion gap 9 5 - 15  CBC     Status: Abnormal   Collection Time: 10/29/16  9:39 PM  Result Value Ref Range   WBC 14.6 (H) 4.0 - 10.5 K/uL   RBC 4.35 3.87 - 5.11 MIL/uL   Hemoglobin 12.5 12.0 - 15.0 g/dL   HCT 38.4 36.0 - 46.0 %   MCV 88.3 78.0 - 100.0 fL   MCH 28.7 26.0 - 34.0 pg   MCHC 32.6 30.0 - 36.0 g/dL   RDW 13.1 11.5 - 15.5 %   Platelets 169 150 - 400 K/uL  Urinalysis, Routine w reflex microscopic     Status: Abnormal   Collection Time: 10/29/16  9:43 PM  Result Value Ref Range   Color, Urine YELLOW YELLOW   APPearance HAZY (A) CLEAR  Specific Gravity, Urine 1.014 1.005 - 1.030   pH 7.0 5.0 - 8.0   Glucose, UA NEGATIVE NEGATIVE mg/dL   Hgb urine dipstick NEGATIVE NEGATIVE   Bilirubin Urine NEGATIVE NEGATIVE   Ketones, ur 20 (A) NEGATIVE mg/dL   Protein, ur NEGATIVE NEGATIVE mg/dL   Nitrite NEGATIVE NEGATIVE   Leukocytes, UA TRACE (A) NEGATIVE   RBC / HPF 0-5 0 - 5 RBC/hpf   WBC, UA 0-5 0 - 5 WBC/hpf   Bacteria, UA MANY (A) NONE SEEN   Squamous Epithelial / LPF 0-5 (A) NONE SEEN   Mucus PRESENT   I-Stat Beta hCG blood, ED ([facility], [facility], [facility] only)     Status: None   Collection Time: 10/29/16 10:11 PM  Result Value Ref Range   I-stat hCG, quantitative <5.0 <5 mIU/mL   Comment 3            Comment:   GEST. AGE      CONC.  (mIU/mL)   <=1 WEEK        5 - 50     2 WEEKS       50 - 500     3 WEEKS       100 - 10,000     4 WEEKS     1,000 - 30,000        FEMALE AND NON-PREGNANT FEMALE:     LESS THAN 5 mIU/mL    US Abdomen Complete  Result Date: 10/30/2016 CLINICAL DATA:  Epigastric  pain EXAM: ABDOMEN ULTRASOUND COMPLETE COMPARISON:  Radiograph 10/30/2016 FINDINGS: Gallbladder: Filled with echogenic sludge and multiple small stones. Normal wall thickness. Negative sonographic Murphy. Tiny fluid between the liver and gallbladder. Common bile duct: Diameter: 2.6 mm Liver: No focal lesion identified. Within normal limits in parenchymal echogenicity. Portal vein is patent on color Doppler imaging with normal direction of blood flow towards the liver. IVC: No abnormality visualized. Pancreas: Visualized portion unremarkable. Spleen: Enlarged with volume of 487 cubic cm Right Kidney: Length: 11.6 cm. Echogenicity within normal limits. No mass or hydronephrosis visualized. Left Kidney: Length: 11.7 cm. Echogenicity within normal limits. No mass or hydronephrosis visualized. Abdominal aorta: No aneurysm visualized. Limited visibility at the bifurcation Other findings: None. IMPRESSION: 1. Diffuse sludge and stone filled gallbladder with tiny fluid between the liver and gallbladder. Normal wall thickness and no sonographic Murphy. Findings are nonspecific. Hepatobiliary nuclear medicine correlation could be helpful if clinical concern for acute cholecystitis 2. Negative for biliary dilatation 3. Enlarged spleen Electronically Signed   By: Donavan Foil M.D.   On: 10/30/2016 01:52   Dg Abd Acute W/chest  Result Date: 10/30/2016 CLINICAL DATA:  Epigastric abdominal pain EXAM: DG ABDOMEN ACUTE W/ 1V CHEST COMPARISON:  None. FINDINGS: Single-view chest demonstrates no consolidation or effusion. Normal heart size. No pneumothorax Supine and upright views of the abdomen demonstrate no free air beneath the diaphragm. Nonobstructed bowel gas pattern with large formed stools in the right upper quadrant. No abnormal calcification IMPRESSION: Nonobstructed gas pattern with large amount of stool in the right abdomen. No acute cardiopulmonary disease. Electronically Signed   By: Donavan Foil M.D.   On:  10/30/2016 01:10    Review of Systems  Constitutional: Negative for chills, fever and malaise/fatigue.  HENT: Negative for ear discharge, hearing loss and sore throat.   Eyes: Negative for blurred vision and discharge.  Respiratory: Negative for cough and shortness of breath.   Cardiovascular: Negative for chest pain, orthopnea and leg swelling.  Gastrointestinal: Positive  for abdominal pain, nausea and vomiting. Negative for constipation, diarrhea and heartburn.  Musculoskeletal: Negative for myalgias and neck pain.  Skin: Negative for itching and rash.  Neurological: Negative for dizziness, focal weakness, seizures and loss of consciousness.  Endo/Heme/Allergies: Negative for environmental allergies. Does not bruise/bleed easily.  Psychiatric/Behavioral: Negative for depression and suicidal ideas.  All other systems reviewed and are negative.   Blood pressure 120/64, pulse 77, temperature 98.4 F (36.9 C), temperature source Oral, resp. rate (!) 22, height 5' 4"  (1.626 m), weight 106.6 kg (235 lb), last menstrual period 09/30/2016, SpO2 99 %. Physical Exam  Constitutional: She is oriented to person, place, and time. Vital signs are normal. She appears well-developed and well-nourished.  Conversant No acute distress  Eyes: Pupils are equal, round, and reactive to light. Conjunctivae, EOM and lids are normal. No scleral icterus.  No lid lag Moist conjunctiva  Neck: Normal range of motion. Neck supple. No tracheal tenderness present. No thyromegaly present.  No cervical lymphadenopathy  Cardiovascular: Normal rate, regular rhythm and intact distal pulses.   No murmur heard. Respiratory: Effort normal and breath sounds normal. She has no wheezes. She has no rales.  GI: Soft. Bowel sounds are normal. She exhibits no distension and no mass. There is no hepatosplenomegaly. There is tenderness (RUQ). There is no rebound and no guarding. No hernia.  Musculoskeletal: Normal range of motion.   Neurological: She is alert and oriented to person, place, and time.  Normal gait and station  Skin: Skin is warm. No rash noted. No cyanosis. Nails show no clubbing.  Normal skin turgor  Psychiatric: Judgment normal.  Appropriate affect     Assessment/Plan 26 year old female with likely acute cholecystitis.  1.  We will admit the patient for laparoscopic cholecystectomy.  I will discussed the case with Dr. Ninfa Linden. 2.  All risks and benefits were discussed with the patient to generally include: infection, bleeding, possible need for post op ERCP, damage to the bile ducts, and bile leak. Alternatives were offered and described.  All questions were answered and the patient voiced understanding of the procedure and wishes to proceed at this point with a laparoscopic cholecystectomy   Reyes Ivan, MD 10/30/2016, 5:07 AM

## 2016-10-30 NOTE — ED Provider Notes (Signed)
[facility]   CSN: [id number] Arrival date & time: 10/29/16  2128     History   Chief Complaint Chief Complaint  Patient presents with  . Abdominal Pain  . Emesis    HPI Sydney [name] is a 26 y.o. female with a hx of polio disorder, hypothyroidism presents to the Emergency Department complaining of gradual, persistent, progressively worsening epigastric abdominal pain onset 24 hours ago which has been persistent.  Pt reports she has had a 70 pound weight loss in the last 18 months.  She reports she has done this through eating primarily vegan diet and very small meals.  She reports that one week ago she had a very large veggie burger and [place] fries which caused 24 hours of epigastric abdominal pain that felt like her previous GERD but resolved spontaneously.  Symptoms did not return until yesterday.  Patient reports she was seen earlier today at an urgent care who recommended that she increase her dose of omeprazole.  She reports she did so and subsequently had an episode of emesis this evening.  Patient reports nonbloody but possibly bilious emesis describing it as an awl of green color.  No previous abdominal surgeries.  No known tick bites, no international travel.  Nothing seems to make her symptoms better or worse.  She denies fevers or chills, chest pain, shortness of breath, headache, neck pain, diarrhea.  Patient reports last bowel movement was 4 days ago and normal.   The history is provided by the patient and medical records. No language interpreter was used.    Past Medical History:  Diagnosis Date  . Thyroid disease     Patient Active Problem List   Diagnosis Date Noted  . Acute cholecystitis 10/30/2016    History reviewed. No pertinent surgical history.  OB History    No data available       Home Medications    Prior to Admission medications   Medication Sig Start Date End Date Taking? Authorizing  Provider  asenapine (SAPHRIS) 5 MG SUBL 24 hr tablet Place 10 mg under the tongue daily.   Yes [provider]  benztropine (COGENTIN) 1 MG tablet Take 1.5 mg by mouth 2 (two) times daily.   Yes [provider]  Cariprazine HCl (VRAYLAR) 6 MG CAPS Take 6 mg by mouth daily.   Yes [provider]  levothyroxine (SYNTHROID, LEVOTHROID) 50 MCG tablet Take 50 mcg by mouth daily before breakfast.   Yes [provider]  lithium carbonate (ESKALITH) 450 MG CR tablet Take 450 mg by mouth 2 (two) times daily.   Yes [provider]  omeprazole (PRILOSEC) 20 MG capsule Take 40 mg by mouth daily.   Yes [provider]  propranolol (INDERAL) 60 MG tablet Take 60 mg by mouth 2 (two) times daily.   Yes [provider]  topiramate (TOPAMAX) 100 MG tablet Take 100 mg by mouth 2 (two) times daily.   Yes [provider]    Family History No family history on file.  Social History Social History  Substance Use Topics  . Smoking status: Never Smoker  . Smokeless tobacco: Never Used  . Alcohol use No     Allergies   Penicillins   Review of Systems Review of Systems  Constitutional: Negative for appetite change, diaphoresis, fatigue, fever and unexpected weight change.  HENT: Negative for mouth sores.   Eyes: Negative for visual disturbance.  Respiratory: Negative for cough, chest tightness,  shortness of breath and wheezing.   Cardiovascular: Negative for chest pain.  Gastrointestinal: Positive for abdominal pain, constipation and vomiting. Negative for diarrhea and nausea.  Endocrine: Negative for polydipsia, polyphagia and polyuria.  Genitourinary: Negative for dysuria, frequency, hematuria and urgency.  Musculoskeletal: Negative for back pain and neck stiffness.  Skin: Negative for rash.  Allergic/Immunologic: Negative for immunocompromised state.  Neurological: Negative for syncope, light-headedness and headaches.    Hematological: Does not bruise/bleed easily.  Psychiatric/Behavioral: Negative for sleep disturbance. The patient is not nervous/anxious.      Physical Exam Updated Vital Signs BP 120/75   Pulse 66   Temp 98.4 F (36.9 C) (Oral)   Resp (!) 21   Ht 5\' 4"  (1.626 m)   Wt 106.6 kg (235 lb)   LMP 09/30/2016   SpO2 100%   BMI 40.34 kg/m   Physical Exam  Constitutional: She appears well-developed and well-nourished. No distress.  Awake, alert, nontoxic appearance  HENT:  Head: Normocephalic and atraumatic.  Mouth/Throat: Oropharynx is clear and moist. No oropharyngeal exudate.  Eyes: Conjunctivae are normal. No scleral icterus.  Neck: Normal range of motion. Neck supple.  Cardiovascular: Normal rate, regular rhythm and intact distal pulses.   Pulmonary/Chest: Effort normal and breath sounds normal. No respiratory distress. She has no wheezes.  Equal chest expansion  Abdominal: Soft. Bowel sounds are normal. She exhibits no mass. There is tenderness in the epigastric area. There is no rigidity, no rebound, no guarding and no CVA tenderness.  Exam limited by body habitus  Musculoskeletal: Normal range of motion. She exhibits no edema.  Neurological: She is alert.  Speech is clear and goal oriented Moves extremities without ataxia  Skin: Skin is warm and dry. She is not diaphoretic.  Psychiatric: She has a normal mood and affect.  Nursing note and vitals reviewed.    ED Treatments / Results  Labs (all labs ordered are listed, but only abnormal results are displayed) Labs Reviewed  COMPREHENSIVE METABOLIC PANEL - Abnormal; Notable for the following:       Result Value   Sodium 134 (*)    CO2 17 (*)    Glucose, Bld 112 (*)    BUN <5 (*)    AST 13 (*)    ALT 9 (*)    All other components within normal limits  CBC - Abnormal; Notable for the following:    WBC 14.6 (*)    All other components within normal limits  URINALYSIS, ROUTINE W REFLEX MICROSCOPIC - Abnormal;  Notable for the following:    APPearance HAZY (*)    Ketones, ur 20 (*)    Leukocytes, UA TRACE (*)    Bacteria, UA MANY (*)    Squamous Epithelial / LPF 0-5 (*)    All other components within normal limits  LIPASE, BLOOD  I-STAT BETA HCG BLOOD, ED ([facility], [facility], [facility] ONLY)    EKG  EKG Interpretation  Date/Time:  Monday October 30 2016 00:16:59 EDT Ventricular Rate:  67 PR Interval:    QRS Duration: 85 QT Interval:  395 QTC Calculation: 417 R Axis:   52 Text Interpretation:  Sinus rhythm Nonspecific T abnormalities, anterior leads No previous ECGs available Confirmed by Glynn Octave [phone]) on 10/30/2016 12:32:38 AM       Radiology US Abdomen Complete  Result Date: 10/30/2016 CLINICAL DATA:  Epigastric pain EXAM: ABDOMEN ULTRASOUND COMPLETE COMPARISON:  Radiograph 10/30/2016 FINDINGS: Gallbladder: Filled with echogenic sludge and multiple small stones. Normal wall thickness. Negative sonographic Murphy.  Tiny fluid between the liver and gallbladder. Common bile duct: Diameter: 2.6 mm Liver: No focal lesion identified. Within normal limits in parenchymal echogenicity. Portal vein is patent on color Doppler imaging with normal direction of blood flow towards the liver. IVC: No abnormality visualized. Pancreas: Visualized portion unremarkable. Spleen: Enlarged with volume of 487 cubic cm Right Kidney: Length: 11.6 cm. Echogenicity within normal limits. No mass or hydronephrosis visualized. Left Kidney: Length: 11.7 cm. Echogenicity within normal limits. No mass or hydronephrosis visualized. Abdominal aorta: No aneurysm visualized. Limited visibility at the bifurcation Other findings: None. IMPRESSION: 1. Diffuse sludge and stone filled gallbladder with tiny fluid between the liver and gallbladder. Normal wall thickness and no sonographic Murphy. Findings are nonspecific. Hepatobiliary nuclear medicine correlation could be helpful if clinical concern for acute cholecystitis 2. Negative for  biliary dilatation 3. Enlarged spleen Electronically Signed   By: Jasmine Pang M.D.   On: 10/30/2016 01:52   Dg Abd Acute W/chest  Result Date: 10/30/2016 CLINICAL DATA:  Epigastric abdominal pain EXAM: DG ABDOMEN ACUTE W/ 1V CHEST COMPARISON:  None. FINDINGS: Single-view chest demonstrates no consolidation or effusion. Normal heart size. No pneumothorax Supine and upright views of the abdomen demonstrate no free air beneath the diaphragm. Nonobstructed bowel gas pattern with large formed stools in the right upper quadrant. No abnormal calcification IMPRESSION: Nonobstructed gas pattern with large amount of stool in the right abdomen. No acute cardiopulmonary disease. Electronically Signed   By: Jasmine Pang M.D.   On: 10/30/2016 01:10    Procedures Procedures (including critical care time)  Medications Ordered in ED Medications  dextrose 5 %-0.9 % sodium chloride infusion ( Intravenous New Bag/Given 10/30/16 0542)  ciprofloxacin (CIPRO) IVPB 400 mg (400 mg Intravenous New Bag/Given 10/30/16 0542)  HYDROmorphone (DILAUDID) injection 1 mg (not administered)  ondansetron (ZOFRAN-ODT) disintegrating tablet 4 mg (not administered)    Or  ondansetron (ZOFRAN) injection 4 mg (not administered)  sodium chloride 0.9 % bolus 1,000 mL (0 mLs Intravenous Stopped 10/30/16 0323)  morphine 4 MG/ML injection 4 mg (4 mg Intravenous Given 10/30/16 0202)  ondansetron (ZOFRAN) injection 4 mg (4 mg Intravenous Given 10/30/16 0202)  morphine 4 MG/ML injection 4 mg (4 mg Intravenous Given 10/30/16 0329)     Initial Impression / Assessment and Plan / ED Course  I have reviewed the triage vital signs and the nursing notes.  Pertinent labs & imaging results that were available during my care of the patient were reviewed by me and considered in my medical decision making (see chart for details).  Clinical Course as of Oct 31 543  Mon Oct 30, 2016  0543 Pt to be admitted to surgery  [HM]    Clinical  Course User Index [HM] Jameis Newsham, Boyd Kerbs    Presents with epigastric abdominal pain and one episode of greenish colored emesis.  Patient with epigastric tenderness but no rebound or guarding.  Negative Murphy sign.  Will assess for possible cholecystitis as patient does have elevated white blood cell count.  Of note, patient with low CO2.  This is been low in the past but it is lower today.  She does have ketones in her urine.  She denies keto diet.  AST/ALT are not elevated.  No elevation in lipase or total bilirubin.  Less likely to be common bile duct obstruction.  03:45 Patient with continued pain.  Ultrasound with concerns for possible cholecystitis.  Free fluid noted around the gallbladder.  Patient discussed with Dr. Derrell Lolling of surgery  who will evaluate for possible admission and/or cholecystectomy.  The patient was discussed with and seen by Dr. Manus Gunning who agrees with the treatment plan.   Final Clinical Impressions(s) / ED Diagnoses   Final diagnoses:  Epigastric pain  Biliary colic    New Prescriptions New Prescriptions   No medications on file     Milta Deiters 10/30/16 0545    Glynn Octave, MD 10/30/16 [phone]

## 2016-10-31 ENCOUNTER — Encounter (HOSPITAL_COMMUNITY): Payer: Self-pay | Admitting: Surgery

## 2016-10-31 MED ORDER — CIPROFLOXACIN HCL 500 MG PO TABS: 500.0000 mg | ORAL_TABLET | Freq: Two times a day (BID) | ORAL | 0 refills | Status: AC

## 2016-10-31 MED ORDER — PROPRANOLOL HCL 10 MG PO TABS
60.0000 mg | ORAL_TABLET | Freq: Two times a day (BID) | ORAL | Status: DC
  Administered 2016-10-31: 60 mg via ORAL
  Filled 2016-10-31: qty 6

## 2016-10-31 MED ORDER — PANTOPRAZOLE SODIUM 40 MG PO TBEC
40.0000 mg | DELAYED_RELEASE_TABLET | Freq: Every day | ORAL | Status: DC
  Administered 2016-10-31: 40 mg via ORAL
  Filled 2016-10-31: qty 1

## 2016-10-31 MED ORDER — TOPIRAMATE 100 MG PO TABS
100.0000 mg | ORAL_TABLET | Freq: Two times a day (BID) | ORAL | Status: DC
  Administered 2016-10-31: 100 mg via ORAL
  Filled 2016-10-31: qty 1

## 2016-10-31 MED ORDER — ASENAPINE MALEATE 5 MG SL SUBL
10.0000 mg | SUBLINGUAL_TABLET | Freq: Every day | SUBLINGUAL | Status: DC
  Administered 2016-10-31: 10 mg via SUBLINGUAL
  Filled 2016-10-31: qty 2

## 2016-10-31 MED ORDER — CIPROFLOXACIN HCL 500 MG PO TABS
500.0000 mg | ORAL_TABLET | Freq: Two times a day (BID) | ORAL | Status: DC
  Administered 2016-10-31: 500 mg via ORAL
  Filled 2016-10-31: qty 1

## 2016-10-31 MED ORDER — LEVOTHYROXINE SODIUM 50 MCG PO TABS: 50.0000 ug | ORAL_TABLET | Freq: Every day | ORAL | Status: DC

## 2016-10-31 MED ORDER — CARIPRAZINE HCL 6 MG PO CAPS: 6.0000 mg | ORAL_CAPSULE | Freq: Every day | ORAL | Status: DC

## 2016-10-31 MED ORDER — BENZTROPINE MESYLATE 1 MG PO TABS
1.5000 mg | ORAL_TABLET | Freq: Two times a day (BID) | ORAL | Status: DC
  Administered 2016-10-31: 1.5 mg via ORAL
  Filled 2016-10-31 (×2): qty 1

## 2016-10-31 MED ORDER — ACETAMINOPHEN 500 MG PO TABS: 1000.0000 mg | ORAL_TABLET | Freq: Three times a day (TID) | ORAL | 0 refills | Status: AC

## 2016-10-31 MED ORDER — OXYCODONE HCL 5 MG PO TABS: 5.0000 mg | ORAL_TABLET | ORAL | 0 refills | Status: AC | PRN

## 2016-10-31 MED ORDER — ACETAMINOPHEN 500 MG PO TABS
1000.0000 mg | ORAL_TABLET | Freq: Three times a day (TID) | ORAL | Status: DC
  Administered 2016-10-31 (×2): 1000 mg via ORAL
  Filled 2016-10-31 (×2): qty 2

## 2016-10-31 MED ORDER — LITHIUM CARBONATE ER 450 MG PO TBCR
450.0000 mg | EXTENDED_RELEASE_TABLET | Freq: Two times a day (BID) | ORAL | Status: DC
  Administered 2016-10-31: 450 mg via ORAL
  Filled 2016-10-31 (×2): qty 1

## 2016-10-31 NOTE — Anesthesia Postprocedure Evaluation (Signed)
Anesthesia Post Note  Patient: Sydney CruelPaige [name]  Procedure(s) Performed: LAPAROSCOPIC CHOLECYSTECTOMY (N/A Abdomen)     Patient location during evaluation: PACU Anesthesia Type: General Level of consciousness: awake and alert Pain management: pain level controlled Vital Signs Assessment: post-procedure vital signs reviewed and stable Respiratory status: spontaneous breathing, nonlabored ventilation, respiratory function stable and patient connected to nasal cannula oxygen Cardiovascular status: blood pressure returned to baseline and stable Postop Assessment: no apparent nausea or vomiting Anesthetic complications: no    Last Vitals:  Vitals:   10/31/16 0115 10/31/16 0555  BP: (!) 103/55 (!) 107/49  Pulse: 71 63  Resp: 16 18  Temp: 36.6 C 36.6 C  SpO2: 98% 98%    Last Pain:  Vitals:   10/31/16 0651  TempSrc:   PainSc: Asleep                 Mellissa Conley P Brooklynne Pereida

## 2016-10-31 NOTE — Progress Notes (Signed)
1 Day Post-Op    CC:  Abdominal pain  Subjective: Patient is a bit tearful.  Anxious at home.  Having abdominal discomfort from her surgery.  She also.  She is on multiple medications for her bipolar disorder.  She also has polycystic ovarian syndrome.  Objective: Vital signs in last 24 hours: Temp:  [97.5 F (36.4 C)-98.6 F (37 C)] 97.9 F (36.6 C) (10/30 0555) Pulse Rate:  [63-79] 63 (10/30 0555) Resp:  [11-22] 18 (10/30 0555) BP: (92-107)/(49-68) 107/49 (10/30 0555) SpO2:  [97 %-100 %] 98 % (10/30 0555) Last BM Date: 10/29/16 375 PO 1850 IV 1500 urine Afebrile since surgery.  Systolic blood pressure 94-107 range No labs   Intake/Output from previous day: 10/29 0701 - 10/30 0700 In: 2222.5 [P.O.:375; I.V.:1647.5; IV Piggyback:200] Out: 1550 [Urine:1500; Blood:50] Intake/Output this shift: No intake/output data recorded.  General appearance: alert, cooperative and no distress Resp: clear to auscultation bilaterally GI: Sore, port sites all look good. Starting to take a diet.  Lab Results:   Recent Labs  10/29/16 2139  WBC 14.6*  HGB 12.5  HCT 38.4  PLT 169    BMET  Recent Labs  10/29/16 2139  NA 134*  K 4.0  CL 108  CO2 17*  GLUCOSE 112*  BUN <5*  CREATININE 0.73  CALCIUM 9.3   PT/INR No results for input(s): LABPROT, INR in the last 72 hours.   Recent Labs Lab 10/29/16 2139  AST 13*  ALT 9*  ALKPHOS 53  BILITOT 1.0  PROT 7.2  ALBUMIN 3.6     Lipase     Component Value Date/Time   LIPASE 16 10/29/2016 2139   Prior to Admission medications   Medication Sig Start Date End Date Taking? Authorizing Provider  asenapine (SAPHRIS) 5 MG SUBL 24 hr tablet Place 10 mg under the tongue daily.   Yes [provider]  benztropine (COGENTIN) 1 MG tablet Take 1.5 mg by mouth 2 (two) times daily.   Yes [provider]  Cariprazine HCl (VRAYLAR) 6 MG CAPS Take 6 mg by mouth daily.   Yes [provider]  levothyroxine  (SYNTHROID, LEVOTHROID) 50 MCG tablet Take 50 mcg by mouth daily before breakfast.   Yes [provider]  lithium carbonate (ESKALITH) 450 MG CR tablet Take 450 mg by mouth 2 (two) times daily.   Yes [provider]  omeprazole (PRILOSEC) 20 MG capsule Take 40 mg by mouth daily.   Yes [provider]  propranolol (INDERAL) 60 MG tablet Take 60 mg by mouth 2 (two) times daily.   Yes [provider]  topiramate (TOPAMAX) 100 MG tablet Take 100 mg by mouth 2 (two) times daily.   Yes [provider]    . ciprofloxacin 400 mg (10/31/16 0825)  . dextrose 5 % and 0.9% NaCl 100 mL/hr at 10/30/16 2136  . lactated ringers 10 mL/hr at 10/30/16 1050   Anti-infectives    Start     Dose/Rate Route Frequency Ordered Stop   10/30/16 1430  ciprofloxacin (CIPRO) IVPB 400 mg  Status:  Discontinued     400 mg 200 mL/hr over 60 Minutes Intravenous Every 12 hours 10/30/16 1420 10/30/16 1443   10/30/16 0600  ciprofloxacin (CIPRO) IVPB 400 mg     400 mg 200 mL/hr over 60 Minutes Intravenous 2 times daily 10/30/16 0509         Medications:   Assessment/Plan Calculus of gallbladder with acute cholecystitis, without mention of obstruction Laparoscopic cholecystectomy,  10/30/16, Dr. Loretha Brasil. Bipolar disorder Polycystic ovarian syndrome Hypothyroid FEN: IV fluids/regular diet ID: Cipro 10/30/16  Day 2 DVT: SCDs Foley: None Follow-up: [facility]   Plan:  Mobilize, she  Cannot take Ibuprofen with her lithium. So she will have to take Tylenol and oxycodone.  Drd. Magnus Ivan want to continue antibiotics for 7 day. Switch to PO Cipro. Hopefully home later today after she has been up and around.        LOS: 0 days    Sydney [name] 10/31/[id number]

## 2016-10-31 NOTE — Discharge Summary (Signed)
Physician Discharge Summary  Patient ID: Sydney [name] MRN: [id number] DOB/AGE: Dec 04, 1990 26 y.o.  Admit date: 10/29/2016 Discharge date: 10/31/2016  Admission Diagnoses:  Acute cholecystitis Bipolar disorder Polycystic ovarian syndrome Hypothyroid Discharge Diagnoses:  Acute cholecystitis Bipolar disorder Polycystic ovarian syndrome Hypothyroid  Active Problems:   Acute cholecystitis   PROCEDURES: Laparoscopic cholecystectomy 10/30/16 Dr. Rikki Spearingouglas Blackman  Hospital Course:  Patient is a 26 year old female with approximate 24 history of epigastric abdominal pain that radiates to the right upper quadrant.  Patient states that this is the first occurrence of this particular type of pain.  States that 2 days ago she did have a high fatty meal.  Patient states that her pain last night was associated with nausea and emesis.  Patient denies any diarrhea.  Secondary to continued pain patient presented to the ER for further evaluation.  Upon evaluation in the ER she underwent ultrasound which revealed stone filled gallbladder with marginal gallbladder wall thickening.  Secondary to patient's physical exam symptoms and radiological findings general surgery was consulted for further evaluation and management.  She was admitted by Dr. Derrell Lollingamirez and taken to the operating room later that day by DR. Abigail Miyamotoouglas Blackman. She underwent laparoscopic cholecystectomy, and tolerated the procedure well.  She returned to the floor and her diet was advanced.  She has been mobilized and is doing well.  She lives alone somewhat anxious about going home.  We plan to discharge her home later this afternoon after she is eating a regular diet.  She is on lithium and therefore cannot take ibuprofen.  Pain control will be plain Tylenol and oxycodone.  CBC Latest Ref Rng & Units 10/29/2016 05/27/2016  WBC 4.0 - 10.5 K/uL 14.6(H) 10.3  Hemoglobin 12.0 - 15.0 g/dL 78.212.5 95.612.7  Hematocrit 21.336.0 - 46.0 % 38.4 39.3   Platelets 150 - 400 K/uL 169 188   CMP Latest Ref Rng & Units 10/29/2016 05/27/2016  Glucose 65 - 99 mg/dL 086(V112(H) 784(O100(H)  BUN 6 - 20 mg/dL <9(G<5(L) 6  Creatinine 2.950.44 - 1.00 mg/dL 2.840.73 1.32(G1.05(H)  Sodium 401135 - 145 mmol/L 134(L) 135  Potassium 3.5 - 5.1 mmol/L 4.0 4.0  Chloride 101 - 111 mmol/L 108 105  CO2 22 - 32 mmol/L 17(L) 21(L)  Calcium 8.9 - 10.3 mg/dL 9.3 9.6  Total Protein 6.5 - 8.1 g/dL 7.2 7.2  Total Bilirubin 0.3 - 1.2 mg/dL 1.0 0.6  Alkaline Phos 38 - 126 U/L 53 53  AST 15 - 41 U/L 13(L) 16  ALT 14 - 54 U/L 9(L) 10(L)    Condition on discharge: Improved   Disposition: 01-Home or Self Care   Allergies as of 10/31/2016      Reactions   Latex    Penicillins    Hives      Medication List    TAKE these medications   acetaminophen 500 MG tablet Commonly known as:  TYLENOL Take 2 tablets (1,000 mg total) by mouth every 8 (eight) hours.   benztropine 1 MG tablet Commonly known as:  COGENTIN Take 1.5 mg by mouth 2 (two) times daily.   ciprofloxacin 500 MG tablet Commonly known as:  CIPRO Take 1 tablet (500 mg total) by mouth 2 (two) times daily.   levothyroxine 50 MCG tablet Commonly known as:  SYNTHROID, LEVOTHROID Take 50 mcg by mouth daily before breakfast.   lithium carbonate 450 MG CR tablet Commonly known as:  ESKALITH Take 450 mg by mouth 2 (two) times daily.   omeprazole 20 MG capsule Commonly  known as:  PRILOSEC Take 40 mg by mouth daily.   oxyCODONE 5 MG immediate release tablet Commonly known as:  Oxy IR/ROXICODONE Take 1-2 tablets (5-10 mg total) by mouth every 4 (four) hours as needed for moderate pain.   propranolol 60 MG tablet Commonly known as:  INDERAL Take 60 mg by mouth 2 (two) times daily.   SAPHRIS 5 MG Subl 24 hr tablet Generic drug:  asenapine Place 10 mg under the tongue daily.   topiramate 100 MG tablet Commonly known as:  TOPAMAX Take 100 mg by mouth 2 (two) times daily.   VRAYLAR 6 MG Caps Generic drug:  Cariprazine  HCl Take 6 mg by mouth daily.      Follow-up Information    [facility], [facility] Follow up on 11/14/2016.   Specialty:  General Surgery Why:  Your appointment is at 10:30 AM.  Be there 30 minutes early for check in.  Bring your photo ID and insurance information. Contact information: [address] [place] [address] [phone]           Signed: Sherrie George 10/31/2016, 2:44 PM

## 2016-10-31 NOTE — Progress Notes (Signed)
Pt for discharge going home, prescription given, next appointment, discontinue IV line, given all her personal belongings, accompanied by her boyfriend, assisted by Nurse tech via wheelchair, no complain of pain at this time, explained the meds and health instruction agreed and understood.

## 2016-11-02 DIAGNOSIS — F3164 Bipolar disorder, current episode mixed, severe, with psychotic features: Secondary | ICD-10-CM | POA: Diagnosis not present

## 2016-11-09 DIAGNOSIS — F3164 Bipolar disorder, current episode mixed, severe, with psychotic features: Secondary | ICD-10-CM | POA: Diagnosis not present

## 2016-11-21 DIAGNOSIS — F3164 Bipolar disorder, current episode mixed, severe, with psychotic features: Secondary | ICD-10-CM | POA: Diagnosis not present

## 2016-11-30 DIAGNOSIS — F3164 Bipolar disorder, current episode mixed, severe, with psychotic features: Secondary | ICD-10-CM | POA: Diagnosis not present

## 2016-12-07 DIAGNOSIS — F3164 Bipolar disorder, current episode mixed, severe, with psychotic features: Secondary | ICD-10-CM | POA: Diagnosis not present

## 2016-12-19 DIAGNOSIS — F3164 Bipolar disorder, current episode mixed, severe, with psychotic features: Secondary | ICD-10-CM | POA: Diagnosis not present

## 2016-12-21 DIAGNOSIS — F312 Bipolar disorder, current episode manic severe with psychotic features: Secondary | ICD-10-CM | POA: Diagnosis not present

## 2016-12-28 DIAGNOSIS — F3164 Bipolar disorder, current episode mixed, severe, with psychotic features: Secondary | ICD-10-CM | POA: Diagnosis not present

## 2017-01-05 DIAGNOSIS — F3164 Bipolar disorder, current episode mixed, severe, with psychotic features: Secondary | ICD-10-CM | POA: Diagnosis not present

## 2017-01-11 DIAGNOSIS — F3164 Bipolar disorder, current episode mixed, severe, with psychotic features: Secondary | ICD-10-CM | POA: Diagnosis not present

## 2017-01-18 DIAGNOSIS — F3164 Bipolar disorder, current episode mixed, severe, with psychotic features: Secondary | ICD-10-CM | POA: Diagnosis not present

## 2017-01-25 DIAGNOSIS — F3164 Bipolar disorder, current episode mixed, severe, with psychotic features: Secondary | ICD-10-CM | POA: Diagnosis not present

## 2017-02-01 DIAGNOSIS — F3164 Bipolar disorder, current episode mixed, severe, with psychotic features: Secondary | ICD-10-CM | POA: Diagnosis not present

## 2017-02-08 DIAGNOSIS — F3164 Bipolar disorder, current episode mixed, severe, with psychotic features: Secondary | ICD-10-CM | POA: Diagnosis not present

## 2017-02-14 DIAGNOSIS — E282 Polycystic ovarian syndrome: Secondary | ICD-10-CM | POA: Diagnosis not present

## 2017-02-14 DIAGNOSIS — E039 Hypothyroidism, unspecified: Secondary | ICD-10-CM | POA: Diagnosis not present

## 2017-02-15 DIAGNOSIS — F3164 Bipolar disorder, current episode mixed, severe, with psychotic features: Secondary | ICD-10-CM | POA: Diagnosis not present

## 2017-02-22 DIAGNOSIS — E039 Hypothyroidism, unspecified: Secondary | ICD-10-CM | POA: Diagnosis not present

## 2017-02-22 DIAGNOSIS — E282 Polycystic ovarian syndrome: Secondary | ICD-10-CM | POA: Diagnosis not present

## 2017-03-01 DIAGNOSIS — F3164 Bipolar disorder, current episode mixed, severe, with psychotic features: Secondary | ICD-10-CM | POA: Diagnosis not present

## 2017-03-08 DIAGNOSIS — F3164 Bipolar disorder, current episode mixed, severe, with psychotic features: Secondary | ICD-10-CM | POA: Diagnosis not present

## 2017-03-22 DIAGNOSIS — F3164 Bipolar disorder, current episode mixed, severe, with psychotic features: Secondary | ICD-10-CM | POA: Diagnosis not present

## 2017-03-22 DIAGNOSIS — F312 Bipolar disorder, current episode manic severe with psychotic features: Secondary | ICD-10-CM | POA: Diagnosis not present

## 2017-03-27 DIAGNOSIS — F3164 Bipolar disorder, current episode mixed, severe, with psychotic features: Secondary | ICD-10-CM | POA: Diagnosis not present

## 2017-04-05 DIAGNOSIS — F3164 Bipolar disorder, current episode mixed, severe, with psychotic features: Secondary | ICD-10-CM | POA: Diagnosis not present

## 2017-04-12 DIAGNOSIS — F3164 Bipolar disorder, current episode mixed, severe, with psychotic features: Secondary | ICD-10-CM | POA: Diagnosis not present

## 2017-04-26 DIAGNOSIS — F3164 Bipolar disorder, current episode mixed, severe, with psychotic features: Secondary | ICD-10-CM | POA: Diagnosis not present

## 2017-05-01 DIAGNOSIS — F3164 Bipolar disorder, current episode mixed, severe, with psychotic features: Secondary | ICD-10-CM | POA: Diagnosis not present

## 2017-05-10 DIAGNOSIS — F3164 Bipolar disorder, current episode mixed, severe, with psychotic features: Secondary | ICD-10-CM | POA: Diagnosis not present

## 2017-05-11 DIAGNOSIS — D510 Vitamin B12 deficiency anemia due to intrinsic factor deficiency: Secondary | ICD-10-CM | POA: Diagnosis not present

## 2017-05-11 DIAGNOSIS — Z5181 Encounter for therapeutic drug level monitoring: Secondary | ICD-10-CM | POA: Diagnosis not present

## 2017-05-11 DIAGNOSIS — E559 Vitamin D deficiency, unspecified: Secondary | ICD-10-CM | POA: Diagnosis not present

## 2017-05-17 DIAGNOSIS — F3164 Bipolar disorder, current episode mixed, severe, with psychotic features: Secondary | ICD-10-CM | POA: Diagnosis not present

## 2017-05-24 DIAGNOSIS — F312 Bipolar disorder, current episode manic severe with psychotic features: Secondary | ICD-10-CM | POA: Diagnosis not present

## 2017-05-24 DIAGNOSIS — M25551 Pain in right hip: Secondary | ICD-10-CM | POA: Diagnosis not present

## 2017-05-24 DIAGNOSIS — R2689 Other abnormalities of gait and mobility: Secondary | ICD-10-CM | POA: Diagnosis not present

## 2017-05-31 DIAGNOSIS — R2689 Other abnormalities of gait and mobility: Secondary | ICD-10-CM | POA: Diagnosis not present

## 2017-05-31 DIAGNOSIS — F3164 Bipolar disorder, current episode mixed, severe, with psychotic features: Secondary | ICD-10-CM | POA: Diagnosis not present

## 2017-05-31 DIAGNOSIS — M25551 Pain in right hip: Secondary | ICD-10-CM | POA: Diagnosis not present

## 2017-06-05 DIAGNOSIS — M25551 Pain in right hip: Secondary | ICD-10-CM | POA: Diagnosis not present

## 2017-06-05 DIAGNOSIS — R2689 Other abnormalities of gait and mobility: Secondary | ICD-10-CM | POA: Diagnosis not present

## 2017-06-07 DIAGNOSIS — M25551 Pain in right hip: Secondary | ICD-10-CM | POA: Diagnosis not present

## 2017-06-07 DIAGNOSIS — F3164 Bipolar disorder, current episode mixed, severe, with psychotic features: Secondary | ICD-10-CM | POA: Diagnosis not present

## 2017-06-07 DIAGNOSIS — R2689 Other abnormalities of gait and mobility: Secondary | ICD-10-CM | POA: Diagnosis not present

## 2017-06-11 DIAGNOSIS — M25551 Pain in right hip: Secondary | ICD-10-CM | POA: Diagnosis not present

## 2017-06-11 DIAGNOSIS — R2689 Other abnormalities of gait and mobility: Secondary | ICD-10-CM | POA: Diagnosis not present

## 2017-06-13 DIAGNOSIS — M25551 Pain in right hip: Secondary | ICD-10-CM | POA: Diagnosis not present

## 2017-06-13 DIAGNOSIS — R2689 Other abnormalities of gait and mobility: Secondary | ICD-10-CM | POA: Diagnosis not present

## 2017-06-14 DIAGNOSIS — F3164 Bipolar disorder, current episode mixed, severe, with psychotic features: Secondary | ICD-10-CM | POA: Diagnosis not present

## 2017-06-19 DIAGNOSIS — R2689 Other abnormalities of gait and mobility: Secondary | ICD-10-CM | POA: Diagnosis not present

## 2017-06-19 DIAGNOSIS — M25551 Pain in right hip: Secondary | ICD-10-CM | POA: Diagnosis not present

## 2017-06-21 DIAGNOSIS — F3164 Bipolar disorder, current episode mixed, severe, with psychotic features: Secondary | ICD-10-CM | POA: Diagnosis not present

## 2017-06-28 DIAGNOSIS — R2689 Other abnormalities of gait and mobility: Secondary | ICD-10-CM | POA: Diagnosis not present

## 2017-06-28 DIAGNOSIS — M25551 Pain in right hip: Secondary | ICD-10-CM | POA: Diagnosis not present

## 2017-06-28 DIAGNOSIS — F3164 Bipolar disorder, current episode mixed, severe, with psychotic features: Secondary | ICD-10-CM | POA: Diagnosis not present

## 2017-07-03 DIAGNOSIS — F3164 Bipolar disorder, current episode mixed, severe, with psychotic features: Secondary | ICD-10-CM | POA: Diagnosis not present

## 2017-07-10 DIAGNOSIS — Z30012 Encounter for prescription of emergency contraception: Secondary | ICD-10-CM | POA: Diagnosis not present

## 2017-07-12 DIAGNOSIS — M25551 Pain in right hip: Secondary | ICD-10-CM | POA: Diagnosis not present

## 2017-07-12 DIAGNOSIS — R2689 Other abnormalities of gait and mobility: Secondary | ICD-10-CM | POA: Diagnosis not present

## 2017-07-26 DIAGNOSIS — F312 Bipolar disorder, current episode manic severe with psychotic features: Secondary | ICD-10-CM | POA: Diagnosis not present

## 2017-07-26 DIAGNOSIS — E038 Other specified hypothyroidism: Secondary | ICD-10-CM | POA: Diagnosis not present

## 2017-07-26 DIAGNOSIS — R198 Other specified symptoms and signs involving the digestive system and abdomen: Secondary | ICD-10-CM | POA: Diagnosis not present

## 2017-07-26 DIAGNOSIS — F3164 Bipolar disorder, current episode mixed, severe, with psychotic features: Secondary | ICD-10-CM | POA: Diagnosis not present

## 2017-08-02 DIAGNOSIS — F3164 Bipolar disorder, current episode mixed, severe, with psychotic features: Secondary | ICD-10-CM | POA: Diagnosis not present

## 2017-08-16 DIAGNOSIS — F3164 Bipolar disorder, current episode mixed, severe, with psychotic features: Secondary | ICD-10-CM | POA: Diagnosis not present

## 2017-08-30 DIAGNOSIS — R194 Change in bowel habit: Secondary | ICD-10-CM | POA: Diagnosis not present

## 2017-08-30 DIAGNOSIS — F3164 Bipolar disorder, current episode mixed, severe, with psychotic features: Secondary | ICD-10-CM | POA: Diagnosis not present

## 2017-08-30 DIAGNOSIS — R1030 Lower abdominal pain, unspecified: Secondary | ICD-10-CM | POA: Diagnosis not present

## 2017-09-06 DIAGNOSIS — F312 Bipolar disorder, current episode manic severe with psychotic features: Secondary | ICD-10-CM | POA: Diagnosis not present

## 2017-09-13 DIAGNOSIS — F3164 Bipolar disorder, current episode mixed, severe, with psychotic features: Secondary | ICD-10-CM | POA: Diagnosis not present

## 2017-09-20 DIAGNOSIS — F3164 Bipolar disorder, current episode mixed, severe, with psychotic features: Secondary | ICD-10-CM | POA: Diagnosis not present

## 2017-09-21 DIAGNOSIS — R1030 Lower abdominal pain, unspecified: Secondary | ICD-10-CM | POA: Diagnosis not present

## 2017-09-21 DIAGNOSIS — R194 Change in bowel habit: Secondary | ICD-10-CM | POA: Diagnosis not present

## 2017-09-25 DIAGNOSIS — R1030 Lower abdominal pain, unspecified: Secondary | ICD-10-CM | POA: Diagnosis not present

## 2017-09-25 DIAGNOSIS — R194 Change in bowel habit: Secondary | ICD-10-CM | POA: Diagnosis not present

## 2017-09-27 DIAGNOSIS — F3164 Bipolar disorder, current episode mixed, severe, with psychotic features: Secondary | ICD-10-CM | POA: Diagnosis not present

## 2017-10-04 DIAGNOSIS — K580 Irritable bowel syndrome with diarrhea: Secondary | ICD-10-CM | POA: Diagnosis not present

## 2017-10-04 DIAGNOSIS — K219 Gastro-esophageal reflux disease without esophagitis: Secondary | ICD-10-CM | POA: Diagnosis not present

## 2017-10-11 DIAGNOSIS — F3164 Bipolar disorder, current episode mixed, severe, with psychotic features: Secondary | ICD-10-CM | POA: Diagnosis not present

## 2017-10-17 DIAGNOSIS — F3164 Bipolar disorder, current episode mixed, severe, with psychotic features: Secondary | ICD-10-CM | POA: Diagnosis not present

## 2017-10-18 DIAGNOSIS — F312 Bipolar disorder, current episode manic severe with psychotic features: Secondary | ICD-10-CM | POA: Diagnosis not present

## 2017-10-22 DIAGNOSIS — L720 Epidermal cyst: Secondary | ICD-10-CM | POA: Diagnosis not present

## 2017-10-25 DIAGNOSIS — F3164 Bipolar disorder, current episode mixed, severe, with psychotic features: Secondary | ICD-10-CM | POA: Diagnosis not present

## 2017-11-15 DIAGNOSIS — F3164 Bipolar disorder, current episode mixed, severe, with psychotic features: Secondary | ICD-10-CM | POA: Diagnosis not present

## 2017-11-22 DIAGNOSIS — F3164 Bipolar disorder, current episode mixed, severe, with psychotic features: Secondary | ICD-10-CM | POA: Diagnosis not present

## 2017-11-26 DIAGNOSIS — Z5181 Encounter for therapeutic drug level monitoring: Secondary | ICD-10-CM | POA: Diagnosis not present

## 2017-12-06 DIAGNOSIS — F312 Bipolar disorder, current episode manic severe with psychotic features: Secondary | ICD-10-CM | POA: Diagnosis not present

## 2017-12-13 DIAGNOSIS — F3164 Bipolar disorder, current episode mixed, severe, with psychotic features: Secondary | ICD-10-CM | POA: Diagnosis not present

## 2017-12-17 DIAGNOSIS — J00 Acute nasopharyngitis [common cold]: Secondary | ICD-10-CM | POA: Diagnosis not present

## 2017-12-20 DIAGNOSIS — F3164 Bipolar disorder, current episode mixed, severe, with psychotic features: Secondary | ICD-10-CM | POA: Diagnosis not present

## 2018-01-03 DIAGNOSIS — F3164 Bipolar disorder, current episode mixed, severe, with psychotic features: Secondary | ICD-10-CM | POA: Diagnosis not present

## 2018-01-10 DIAGNOSIS — F3164 Bipolar disorder, current episode mixed, severe, with psychotic features: Secondary | ICD-10-CM | POA: Diagnosis not present

## 2018-01-17 DIAGNOSIS — F312 Bipolar disorder, current episode manic severe with psychotic features: Secondary | ICD-10-CM | POA: Diagnosis not present

## 2018-01-17 DIAGNOSIS — F3164 Bipolar disorder, current episode mixed, severe, with psychotic features: Secondary | ICD-10-CM | POA: Diagnosis not present

## 2018-01-24 DIAGNOSIS — F3164 Bipolar disorder, current episode mixed, severe, with psychotic features: Secondary | ICD-10-CM | POA: Diagnosis not present

## 2018-01-31 DIAGNOSIS — F3164 Bipolar disorder, current episode mixed, severe, with psychotic features: Secondary | ICD-10-CM | POA: Diagnosis not present

## 2018-02-07 DIAGNOSIS — F3164 Bipolar disorder, current episode mixed, severe, with psychotic features: Secondary | ICD-10-CM | POA: Diagnosis not present

## 2018-02-21 DIAGNOSIS — F3164 Bipolar disorder, current episode mixed, severe, with psychotic features: Secondary | ICD-10-CM | POA: Diagnosis not present

## 2018-02-28 DIAGNOSIS — F3164 Bipolar disorder, current episode mixed, severe, with psychotic features: Secondary | ICD-10-CM | POA: Diagnosis not present

## 2018-02-28 DIAGNOSIS — F312 Bipolar disorder, current episode manic severe with psychotic features: Secondary | ICD-10-CM | POA: Diagnosis not present

## 2018-03-07 DIAGNOSIS — F3164 Bipolar disorder, current episode mixed, severe, with psychotic features: Secondary | ICD-10-CM | POA: Diagnosis not present

## 2018-03-14 DIAGNOSIS — F3164 Bipolar disorder, current episode mixed, severe, with psychotic features: Secondary | ICD-10-CM | POA: Diagnosis not present

## 2018-03-21 DIAGNOSIS — F3164 Bipolar disorder, current episode mixed, severe, with psychotic features: Secondary | ICD-10-CM | POA: Diagnosis not present

## 2018-03-28 DIAGNOSIS — F3164 Bipolar disorder, current episode mixed, severe, with psychotic features: Secondary | ICD-10-CM | POA: Diagnosis not present

## 2018-04-04 DIAGNOSIS — F3164 Bipolar disorder, current episode mixed, severe, with psychotic features: Secondary | ICD-10-CM | POA: Diagnosis not present

## 2018-04-11 DIAGNOSIS — F312 Bipolar disorder, current episode manic severe with psychotic features: Secondary | ICD-10-CM | POA: Diagnosis not present

## 2018-04-11 DIAGNOSIS — F3164 Bipolar disorder, current episode mixed, severe, with psychotic features: Secondary | ICD-10-CM | POA: Diagnosis not present

## 2018-04-18 DIAGNOSIS — F3164 Bipolar disorder, current episode mixed, severe, with psychotic features: Secondary | ICD-10-CM | POA: Diagnosis not present

## 2018-04-25 DIAGNOSIS — F3164 Bipolar disorder, current episode mixed, severe, with psychotic features: Secondary | ICD-10-CM | POA: Diagnosis not present

## 2018-05-09 DIAGNOSIS — F3164 Bipolar disorder, current episode mixed, severe, with psychotic features: Secondary | ICD-10-CM | POA: Diagnosis not present

## 2018-05-09 DIAGNOSIS — F312 Bipolar disorder, current episode manic severe with psychotic features: Secondary | ICD-10-CM | POA: Diagnosis not present

## 2018-05-16 DIAGNOSIS — F3164 Bipolar disorder, current episode mixed, severe, with psychotic features: Secondary | ICD-10-CM | POA: Diagnosis not present

## 2018-05-23 DIAGNOSIS — F3164 Bipolar disorder, current episode mixed, severe, with psychotic features: Secondary | ICD-10-CM | POA: Diagnosis not present

## 2018-06-04 DIAGNOSIS — F312 Bipolar disorder, current episode manic severe with psychotic features: Secondary | ICD-10-CM | POA: Diagnosis not present

## 2018-06-07 DIAGNOSIS — F3164 Bipolar disorder, current episode mixed, severe, with psychotic features: Secondary | ICD-10-CM | POA: Diagnosis not present

## 2018-06-13 DIAGNOSIS — F3164 Bipolar disorder, current episode mixed, severe, with psychotic features: Secondary | ICD-10-CM | POA: Diagnosis not present

## 2018-06-17 DIAGNOSIS — F3181 Bipolar II disorder: Secondary | ICD-10-CM | POA: Diagnosis not present

## 2018-06-18 DIAGNOSIS — F312 Bipolar disorder, current episode manic severe with psychotic features: Secondary | ICD-10-CM | POA: Diagnosis not present

## 2018-06-19 DIAGNOSIS — F3164 Bipolar disorder, current episode mixed, severe, with psychotic features: Secondary | ICD-10-CM | POA: Diagnosis not present

## 2018-07-01 DIAGNOSIS — F3164 Bipolar disorder, current episode mixed, severe, with psychotic features: Secondary | ICD-10-CM | POA: Diagnosis not present

## 2018-07-03 DIAGNOSIS — F3181 Bipolar II disorder: Secondary | ICD-10-CM | POA: Diagnosis not present

## 2018-07-10 DIAGNOSIS — F3181 Bipolar II disorder: Secondary | ICD-10-CM | POA: Diagnosis not present

## 2018-07-17 DIAGNOSIS — F3181 Bipolar II disorder: Secondary | ICD-10-CM | POA: Diagnosis not present

## 2018-07-19 DIAGNOSIS — E559 Vitamin D deficiency, unspecified: Secondary | ICD-10-CM | POA: Diagnosis not present

## 2018-07-19 DIAGNOSIS — Z5181 Encounter for therapeutic drug level monitoring: Secondary | ICD-10-CM | POA: Diagnosis not present

## 2018-07-23 DIAGNOSIS — F312 Bipolar disorder, current episode manic severe with psychotic features: Secondary | ICD-10-CM | POA: Diagnosis not present

## 2018-07-24 DIAGNOSIS — F3181 Bipolar II disorder: Secondary | ICD-10-CM | POA: Diagnosis not present

## 2018-07-31 DIAGNOSIS — F3181 Bipolar II disorder: Secondary | ICD-10-CM | POA: Diagnosis not present

## 2018-08-07 DIAGNOSIS — F3181 Bipolar II disorder: Secondary | ICD-10-CM | POA: Diagnosis not present

## 2018-08-14 DIAGNOSIS — F3181 Bipolar II disorder: Secondary | ICD-10-CM | POA: Diagnosis not present

## 2018-08-21 DIAGNOSIS — F312 Bipolar disorder, current episode manic severe with psychotic features: Secondary | ICD-10-CM | POA: Diagnosis not present

## 2018-08-28 DIAGNOSIS — F3181 Bipolar II disorder: Secondary | ICD-10-CM | POA: Diagnosis not present

## 2018-09-04 DIAGNOSIS — F3181 Bipolar II disorder: Secondary | ICD-10-CM | POA: Diagnosis not present

## 2018-09-11 DIAGNOSIS — F3181 Bipolar II disorder: Secondary | ICD-10-CM | POA: Diagnosis not present

## 2018-09-18 DIAGNOSIS — F3181 Bipolar II disorder: Secondary | ICD-10-CM | POA: Diagnosis not present

## 2018-09-25 DIAGNOSIS — F3181 Bipolar II disorder: Secondary | ICD-10-CM | POA: Diagnosis not present

## 2018-10-02 DIAGNOSIS — F3181 Bipolar II disorder: Secondary | ICD-10-CM | POA: Diagnosis not present

## 2018-10-02 DIAGNOSIS — F312 Bipolar disorder, current episode manic severe with psychotic features: Secondary | ICD-10-CM | POA: Diagnosis not present

## 2018-10-09 DIAGNOSIS — F3181 Bipolar II disorder: Secondary | ICD-10-CM | POA: Diagnosis not present

## 2018-10-23 DIAGNOSIS — F3181 Bipolar II disorder: Secondary | ICD-10-CM | POA: Diagnosis not present

## 2018-10-30 DIAGNOSIS — F3181 Bipolar II disorder: Secondary | ICD-10-CM | POA: Diagnosis not present

## 2018-11-06 DIAGNOSIS — F3181 Bipolar II disorder: Secondary | ICD-10-CM | POA: Diagnosis not present

## 2018-11-13 DIAGNOSIS — F312 Bipolar disorder, current episode manic severe with psychotic features: Secondary | ICD-10-CM | POA: Diagnosis not present

## 2018-11-13 DIAGNOSIS — F3181 Bipolar II disorder: Secondary | ICD-10-CM | POA: Diagnosis not present

## 2018-11-20 DIAGNOSIS — F3181 Bipolar II disorder: Secondary | ICD-10-CM | POA: Diagnosis not present

## 2018-12-04 DIAGNOSIS — F3181 Bipolar II disorder: Secondary | ICD-10-CM | POA: Diagnosis not present

## 2018-12-12 DIAGNOSIS — F312 Bipolar disorder, current episode manic severe with psychotic features: Secondary | ICD-10-CM | POA: Diagnosis not present

## 2019-01-11 IMAGING — DX DG ABDOMEN ACUTE W/ 1V CHEST
3 series · 3 of 3 positions shown · non-contrast
Comparison: None.

CLINICAL DATA: Epigastric abdominal pain

EXAM:
DG ABDOMEN ACUTE W/ 1V CHEST

[chest pa]
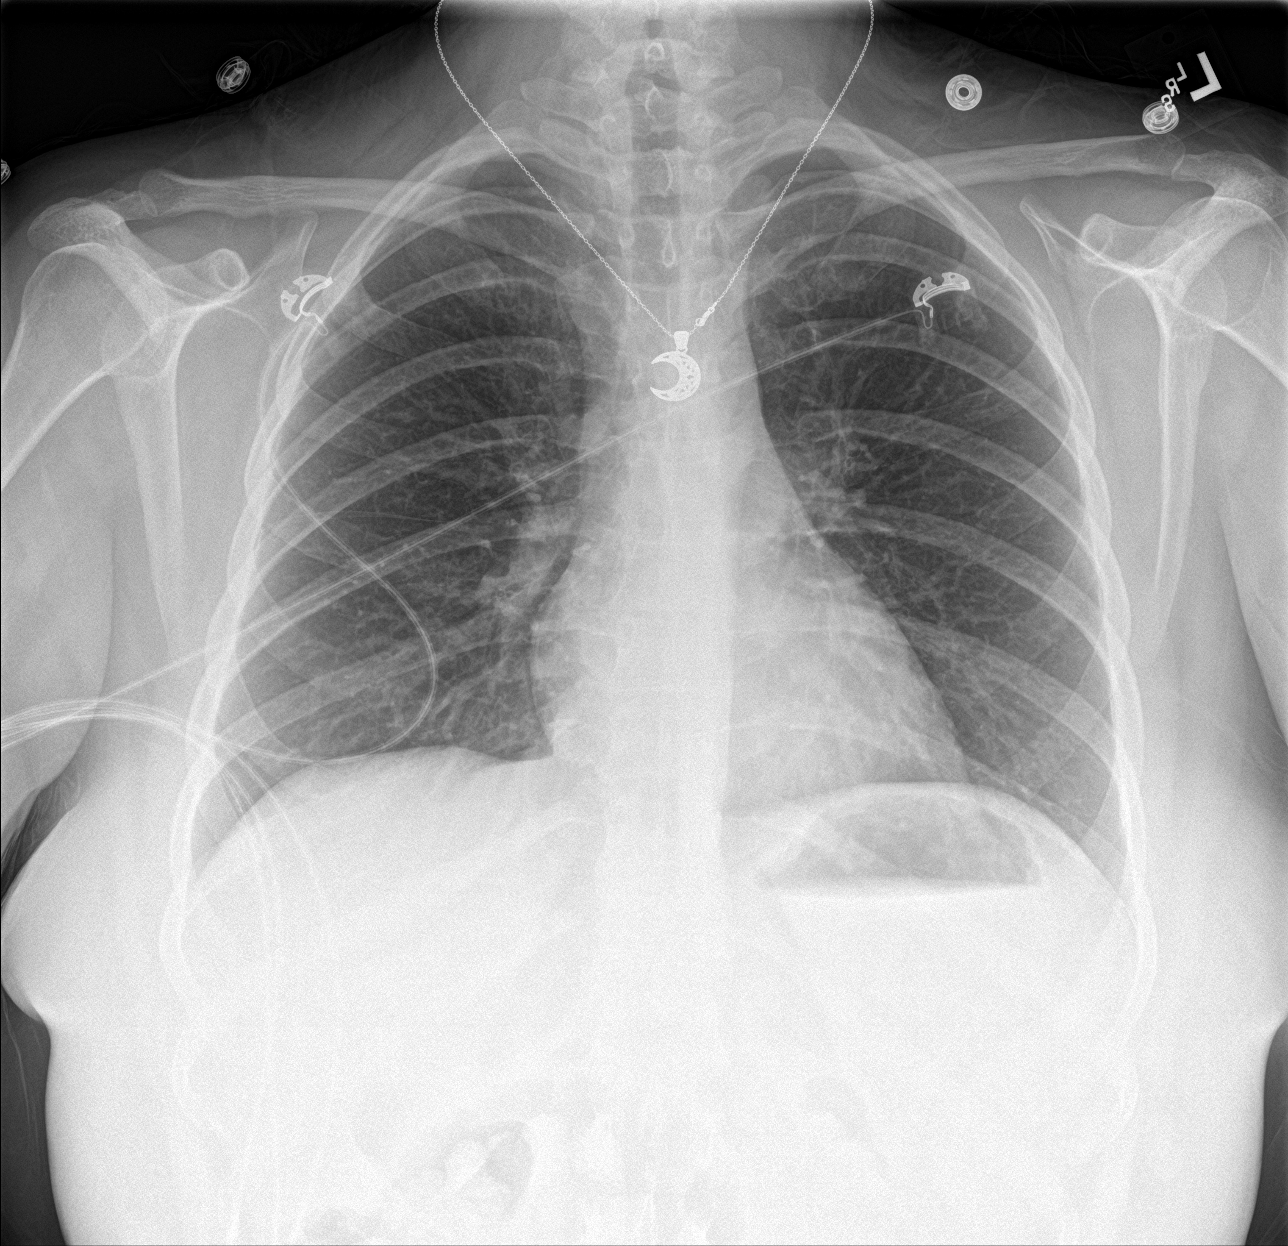

[abdomen erect]
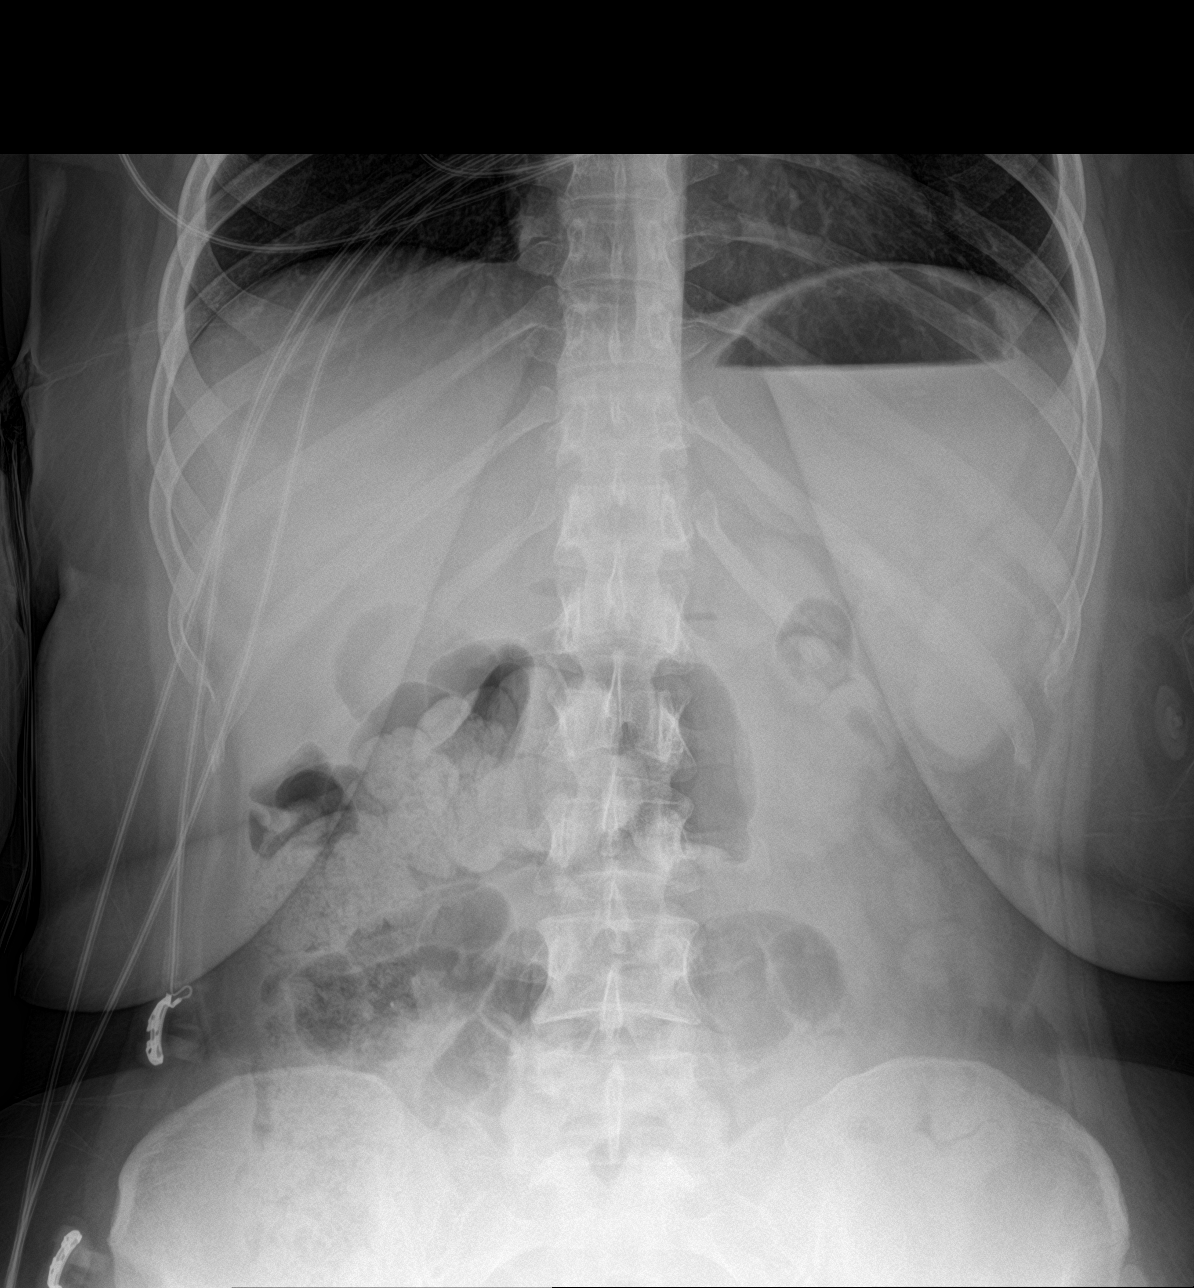

[abdomen supine]
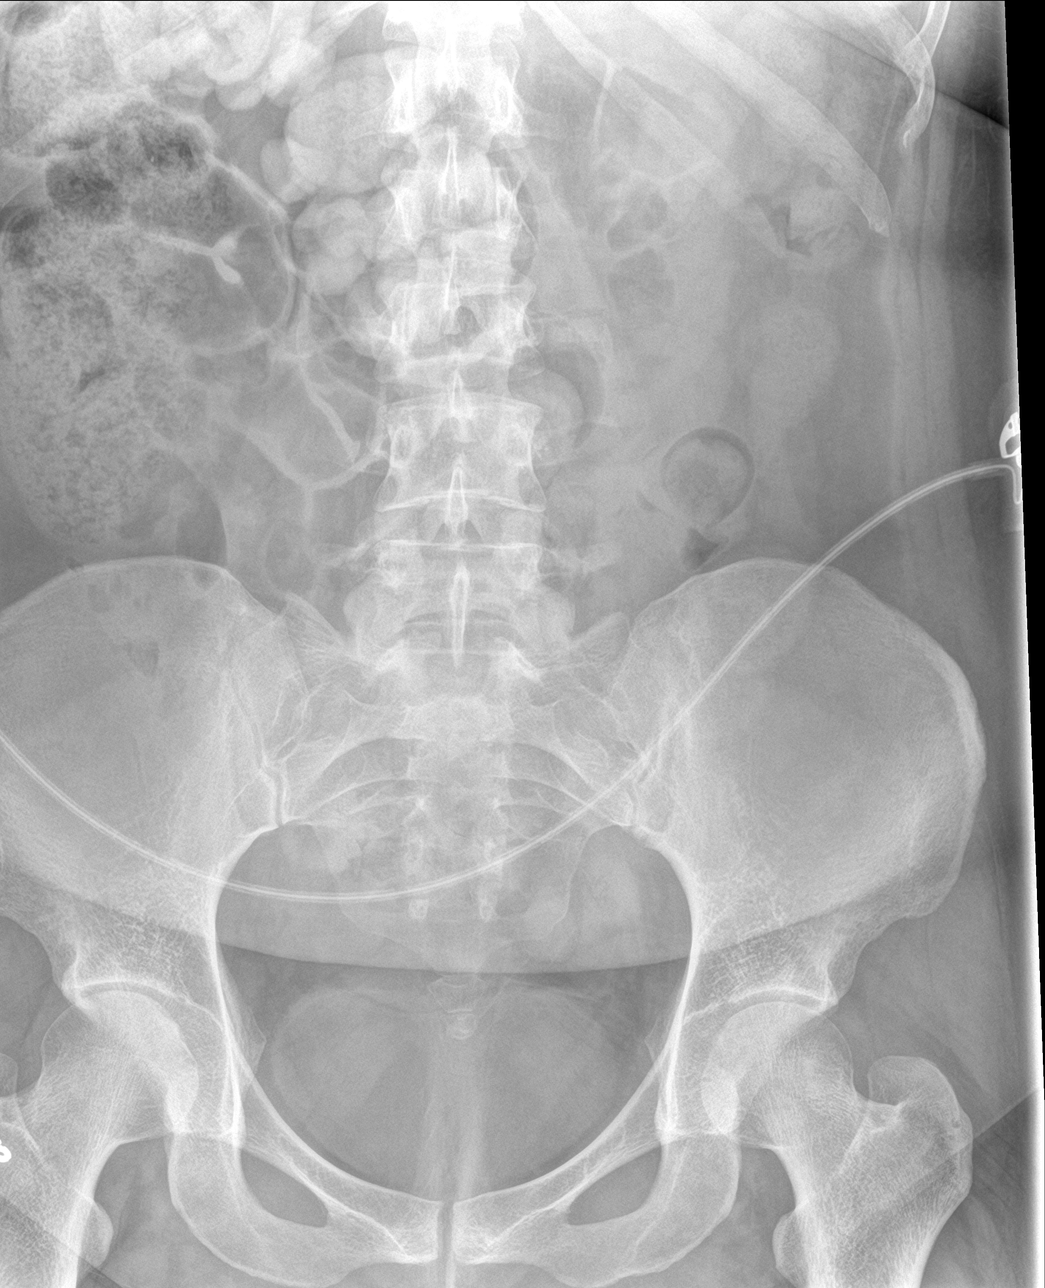

[3 of 3 positions shown; findings below may reference images not displayed]

FINDINGS: Single-view chest demonstrates no consolidation or effusion. Normal
heart size. No pneumothorax

Supine and upright views of the abdomen demonstrate no free air
beneath the diaphragm. Nonobstructed bowel gas pattern with large
formed stools in the right upper quadrant. No abnormal calcification
IMPRESSION: Nonobstructed gas pattern with large amount of stool in the right
abdomen. No acute cardiopulmonary disease.

## 2019-07-09 IMAGING — US US ABDOMEN COMPLETE
1 series · 13 of 25 positions shown · non-contrast
Comparison: Radiograph 10/30/2016

CLINICAL DATA: Epigastric pain

EXAM:
ABDOMEN ULTRASOUND COMPLETE

[Series 1: us abdomen complete · 0.26mm/px · 13 of 92 slices shown]
[im 1/92]
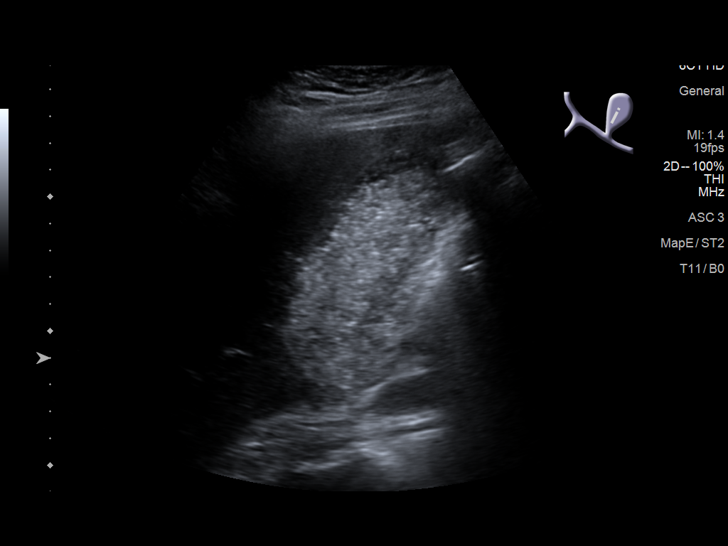
[im 8/92]
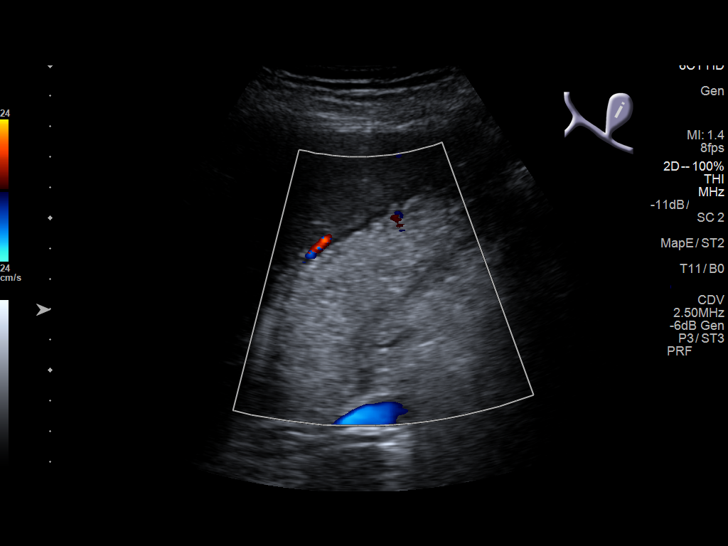
[im 16/92]
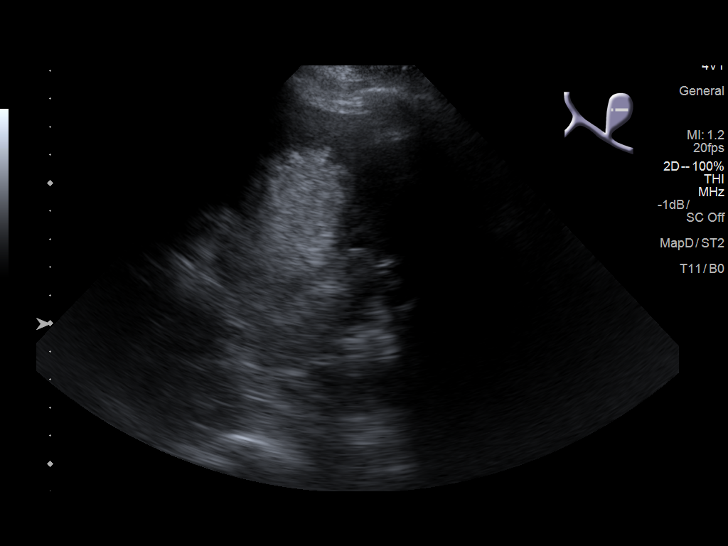
[im 23/92]
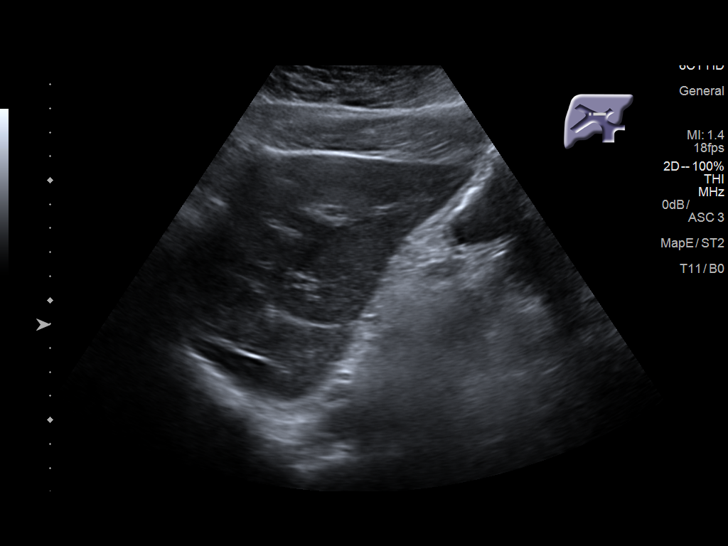
[im 31/92]
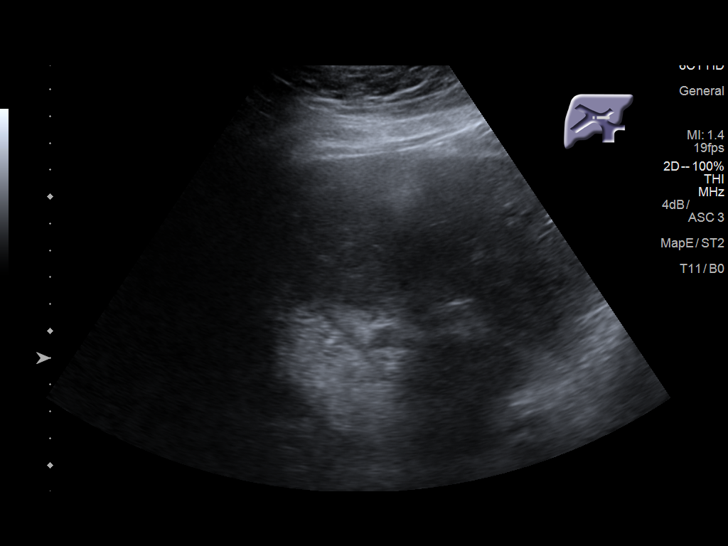
[im 38/92]
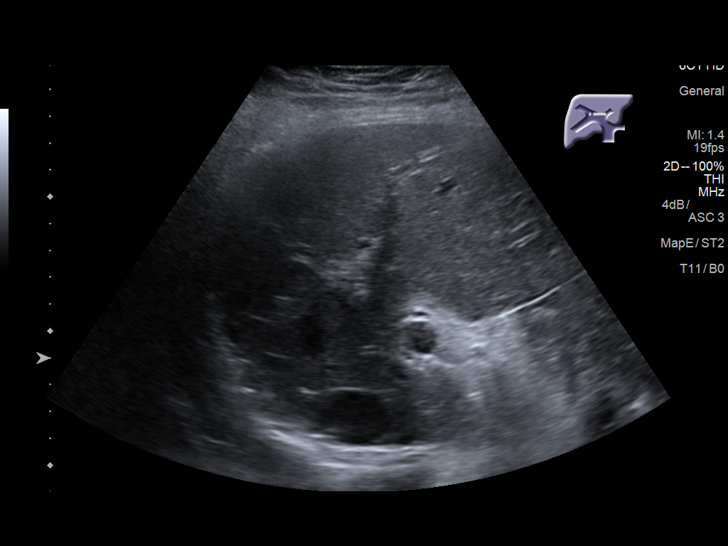
[im 46/92]
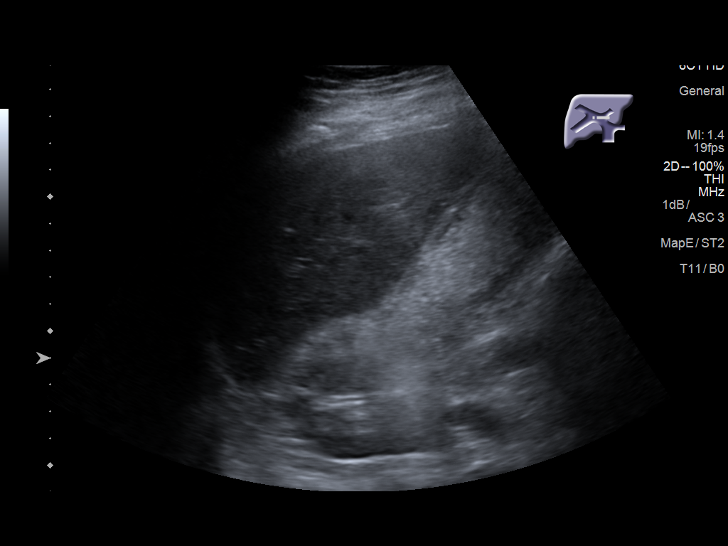
[im 54/92]
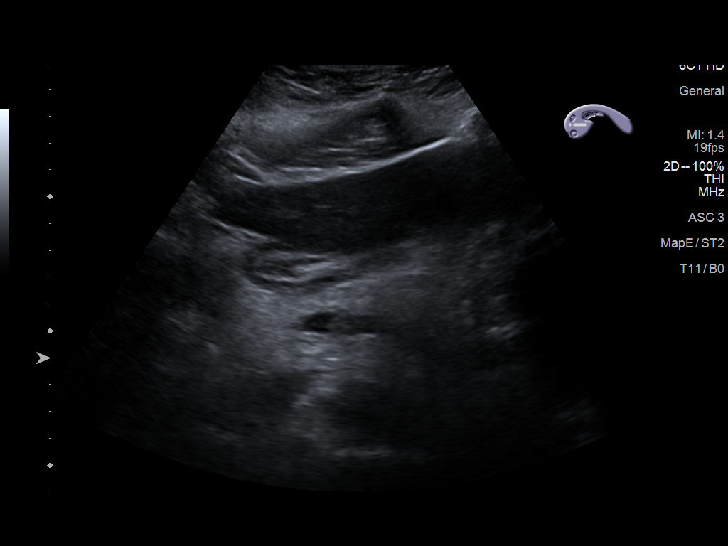
[im 61/92]
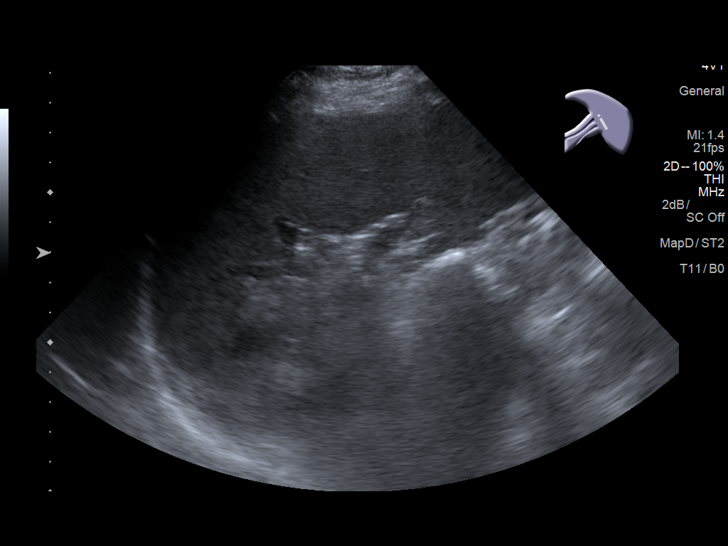
[im 69/92]
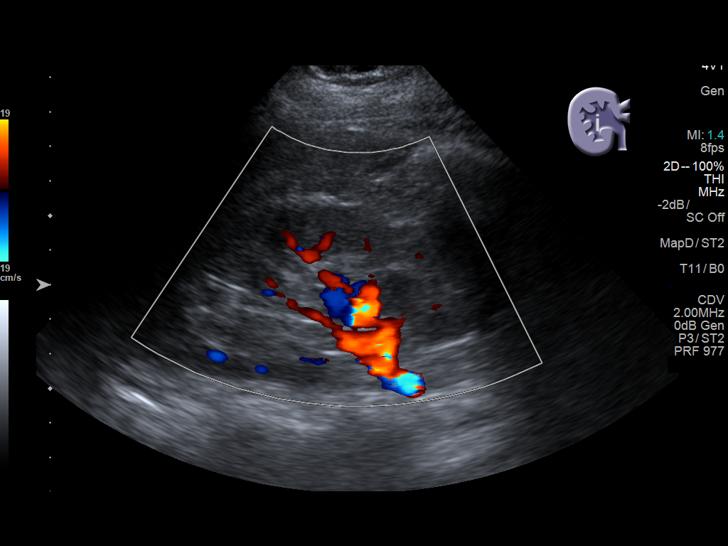
[im 76/92]
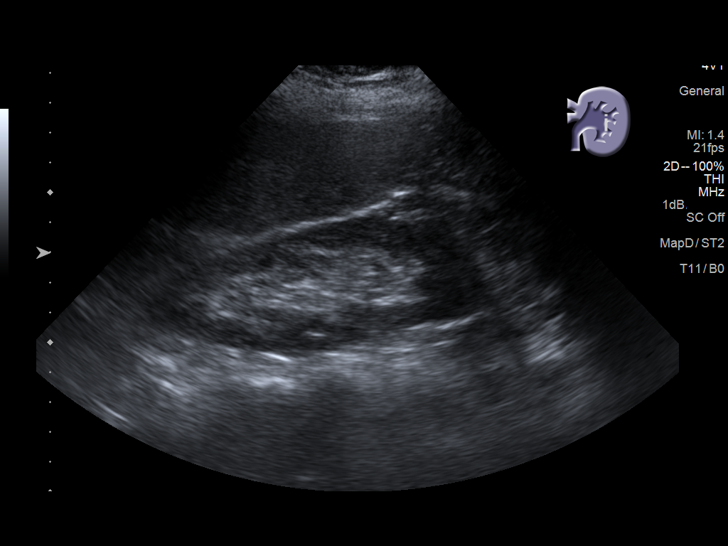
[im 84/92]
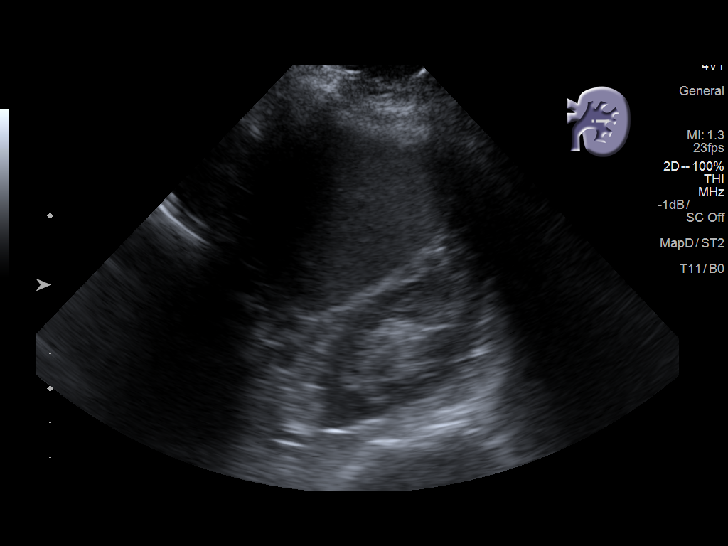
[im 92/92]
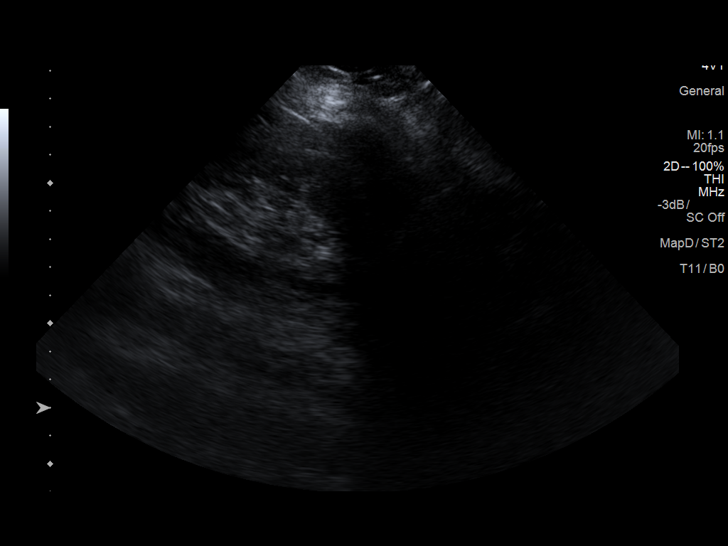

[13 of 25 positions shown; findings below may reference images not displayed]

FINDINGS: Gallbladder: Filled with echogenic sludge and multiple small stones.
Normal wall thickness. Negative sonographic Murphy. Tiny fluid
between the liver and gallbladder.

Common bile duct: Diameter: 2.6 mm

Liver: No focal lesion identified. Within normal limits in
parenchymal echogenicity. Portal vein is patent on color Doppler
imaging with normal direction of blood flow towards the liver.

IVC: No abnormality visualized.

Pancreas: Visualized portion unremarkable.

Spleen: Enlarged with volume of 487 cubic cm

Right Kidney: Length: 11.6 cm. Echogenicity within normal limits. No
mass or hydronephrosis visualized.

Left Kidney: Length: 11.7 cm. Echogenicity within normal limits. No
mass or hydronephrosis visualized.

Abdominal aorta: No aneurysm visualized. Limited visibility at the
bifurcation

Other findings: None.
IMPRESSION: 1. Diffuse sludge and stone filled gallbladder with tiny fluid
between the liver and gallbladder. Normal wall thickness and no
sonographic Murphy. Findings are nonspecific. Hepatobiliary nuclear
medicine correlation could be helpful if clinical concern for acute
cholecystitis
2. Negative for biliary dilatation
3. Enlarged spleen
